# Patient Record
Sex: Female | Born: 1965 | Race: White | Hispanic: No | Smoking: Never smoker
Health system: Southern US, Community
[De-identification: ages and names within clinical notes are randomized; demographics above are authoritative.]

## PROBLEM LIST (undated history)

## (undated) DIAGNOSIS — R87619 Unspecified abnormal cytological findings in specimens from cervix uteri: Secondary | ICD-10-CM

## (undated) DIAGNOSIS — N920 Excessive and frequent menstruation with regular cycle: Secondary | ICD-10-CM

## (undated) DIAGNOSIS — K219 Gastro-esophageal reflux disease without esophagitis: Secondary | ICD-10-CM

## (undated) DIAGNOSIS — Z8742 Personal history of other diseases of the female genital tract: Secondary | ICD-10-CM

## (undated) HISTORY — PX: TONSILLECTOMY AND ADENOIDECTOMY: SUR1326

## (undated) HISTORY — DX: Personal history of other diseases of the female genital tract: Z87.42

## (undated) HISTORY — DX: Unspecified abnormal cytological findings in specimens from cervix uteri: R87.619

## (undated) HISTORY — PX: FOOT SURGERY: SHX648

## (undated) HISTORY — DX: Excessive and frequent menstruation with regular cycle: N92.0

## (undated) HISTORY — PX: APPENDECTOMY: SHX54

## (undated) HISTORY — PX: COLPOSCOPY VULVA W/ BIOPSY: SUR282

---

## 2003-05-21 ENCOUNTER — Ambulatory Visit (HOSPITAL_COMMUNITY): Admission: RE | Admit: 2003-05-21 | Discharge: 2003-05-21 | Payer: Self-pay | Admitting: Orthopaedic Surgery

## 2004-03-18 ENCOUNTER — Ambulatory Visit (HOSPITAL_COMMUNITY): Admission: RE | Admit: 2004-03-18 | Discharge: 2004-03-18 | Payer: Self-pay | Admitting: Obstetrics and Gynecology

## 2004-04-29 ENCOUNTER — Ambulatory Visit (HOSPITAL_COMMUNITY): Admission: RE | Admit: 2004-04-29 | Discharge: 2004-04-29 | Payer: Self-pay | Admitting: Urology

## 2004-11-17 ENCOUNTER — Ambulatory Visit: Payer: Self-pay | Admitting: *Deleted

## 2004-11-20 ENCOUNTER — Ambulatory Visit (HOSPITAL_COMMUNITY): Admission: RE | Admit: 2004-11-20 | Discharge: 2004-11-20 | Payer: Self-pay | Admitting: *Deleted

## 2004-11-20 ENCOUNTER — Ambulatory Visit: Payer: Self-pay | Admitting: Cardiology

## 2004-11-28 ENCOUNTER — Ambulatory Visit: Payer: Self-pay | Admitting: *Deleted

## 2006-06-25 IMAGING — CR DG CHEST 2V
2 series · 2 of 2 positions shown · non-contrast
Comparison: none.

CLINICAL DATA: Screening for work. Knot around sternum.
 TWO VIEW CHEST

[view not recorded (1 of 2)]
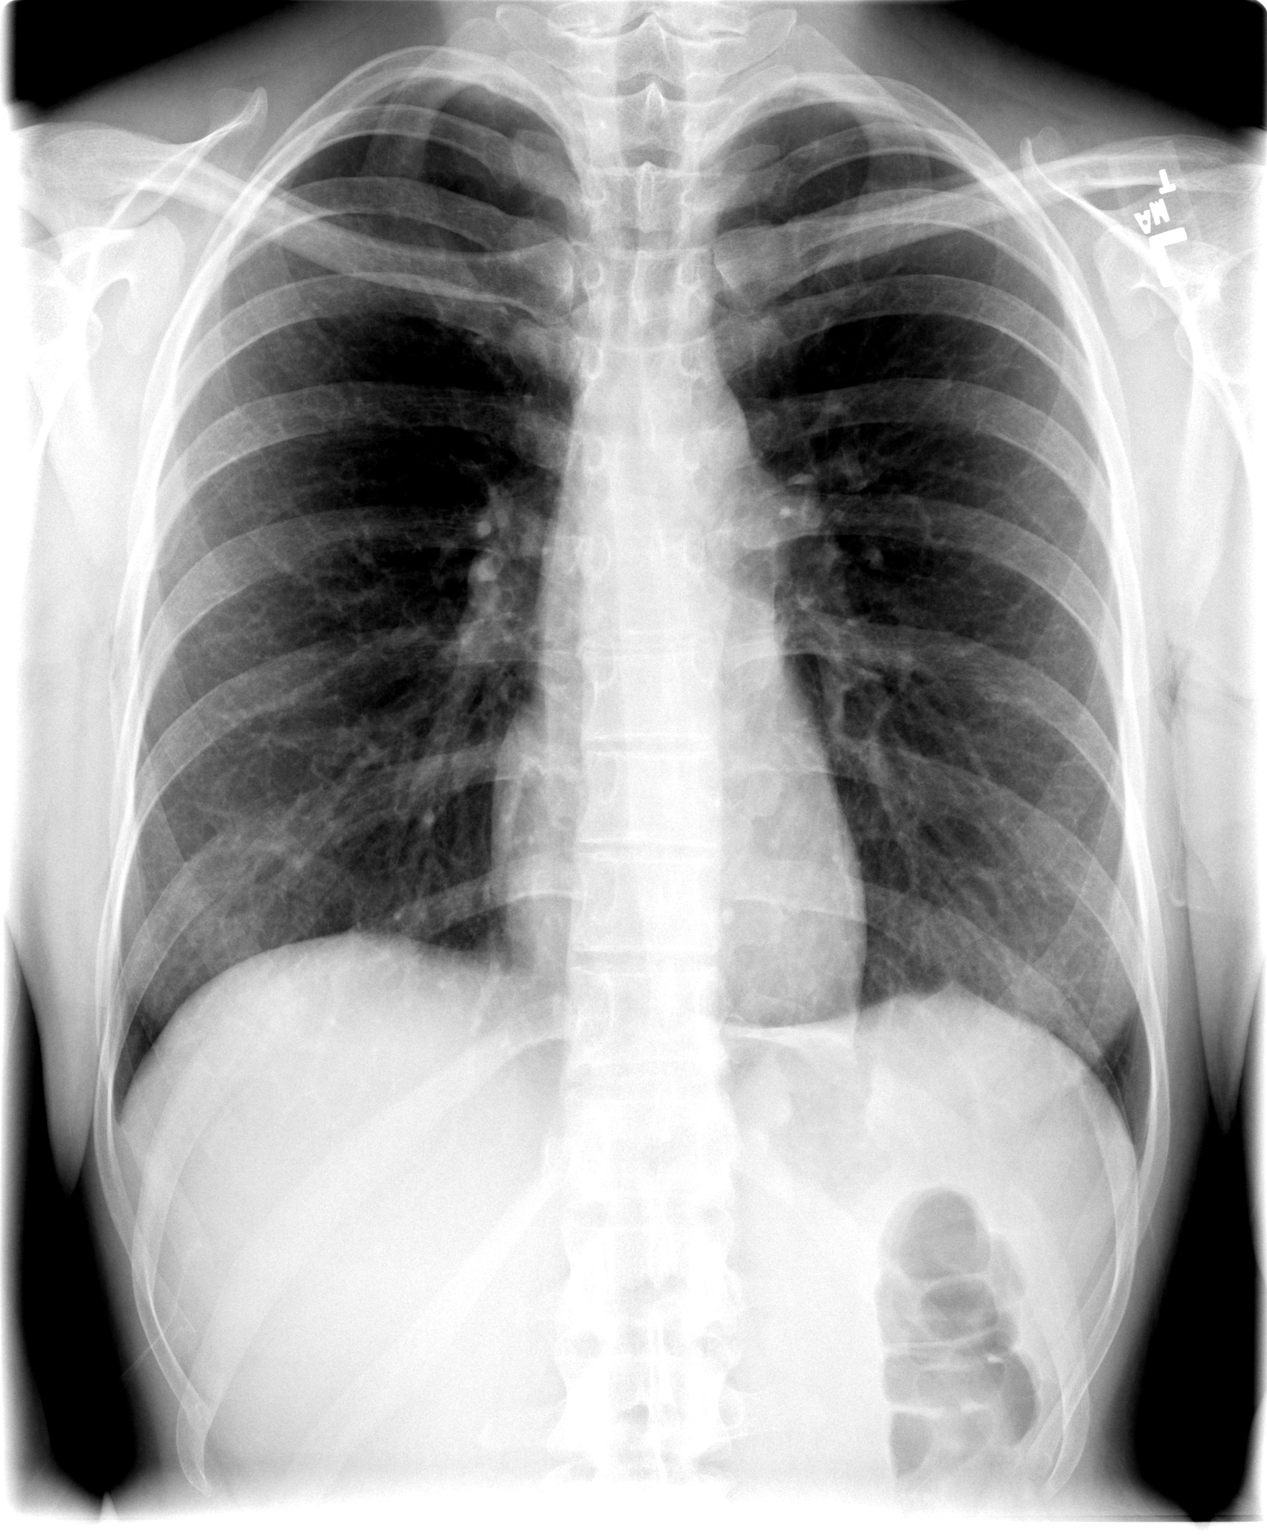

[view not recorded (2 of 2)]
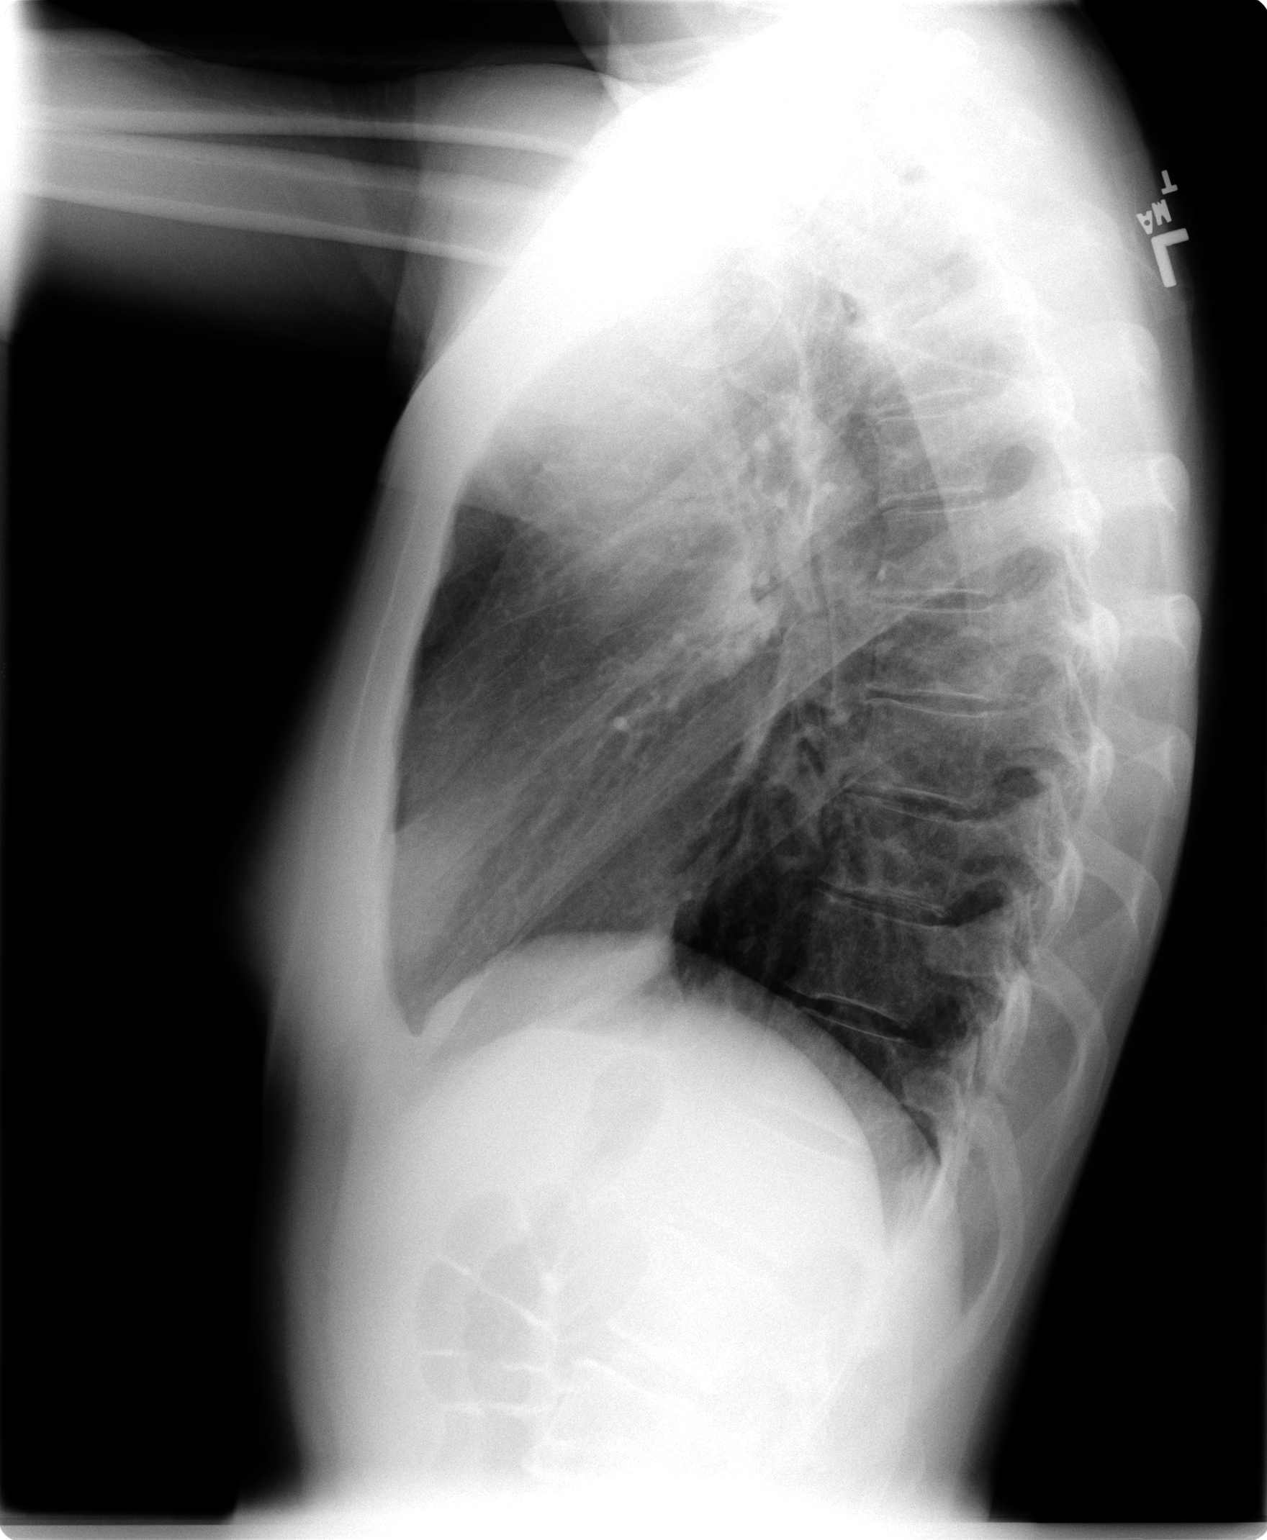

[2 of 2 positions shown; findings below may reference images not displayed]

FINDINGS: Two view chest shows no focal consolidation, edema or effusion.  Cardiopericardial silhouette is within normal limits for size.  Bony structures of the imaged thorax are intact.
 IMPRESSION
 Normal two view chest x-ray. CT scanning would be a more sensitive means to evaluate the sternum if clinically indicated.

## 2007-04-18 ENCOUNTER — Other Ambulatory Visit: Admission: RE | Admit: 2007-04-18 | Discharge: 2007-04-18 | Payer: Self-pay | Admitting: Obstetrics and Gynecology

## 2007-06-21 ENCOUNTER — Ambulatory Visit: Payer: Self-pay

## 2007-12-01 ENCOUNTER — Ambulatory Visit (HOSPITAL_COMMUNITY): Admission: RE | Admit: 2007-12-01 | Discharge: 2007-12-01 | Payer: Self-pay | Admitting: Podiatry

## 2008-05-15 ENCOUNTER — Other Ambulatory Visit: Admission: RE | Admit: 2008-05-15 | Discharge: 2008-05-15 | Payer: Self-pay | Admitting: Obstetrics and Gynecology

## 2008-06-01 ENCOUNTER — Ambulatory Visit (HOSPITAL_COMMUNITY): Admission: RE | Admit: 2008-06-01 | Discharge: 2008-06-01 | Payer: Self-pay | Admitting: Internal Medicine

## 2008-06-06 ENCOUNTER — Encounter (HOSPITAL_COMMUNITY): Admission: RE | Admit: 2008-06-06 | Discharge: 2008-07-06 | Payer: Self-pay | Admitting: Internal Medicine

## 2008-06-21 ENCOUNTER — Ambulatory Visit: Payer: Self-pay

## 2008-11-26 ENCOUNTER — Ambulatory Visit: Payer: Self-pay | Admitting: Family Medicine

## 2009-05-30 ENCOUNTER — Other Ambulatory Visit: Admission: RE | Admit: 2009-05-30 | Discharge: 2009-05-30 | Payer: Self-pay | Admitting: Obstetrics and Gynecology

## 2010-06-04 ENCOUNTER — Other Ambulatory Visit: Admission: RE | Admit: 2010-06-04 | Discharge: 2010-06-04 | Payer: Self-pay | Admitting: Obstetrics and Gynecology

## 2010-11-25 NOTE — Op Note (Signed)
NAME:  JAZMINN, POMALES                 ACCOUNT NO.:  [id number]   MEDICAL RECORD NO.:  [id number]          PATIENT TYPE:  AMB   LOCATION:  DAY                           FACILITY:  [facility]   PHYSICIAN:  Oley Balm. Pricilla Holm, D.P.M.DATE OF BIRTH:  1965-11-24   DATE OF PROCEDURE:  12/01/2007  DATE OF DISCHARGE:                               OPERATIVE REPORT   SURGEON:  Oley Balm. Pricilla Holm, D.P.M.   ANESTHESIA:  Monitored anesthesia care.   PREOPERATIVE DIAGNOSIS:  Tailor's bunion deformity, left foot.   POSTOPERATIVE DIAGNOSIS:  Tailor's bunion deformity, left foot.   PROCEDURE:  Tailor's bunionectomy with fifth metatarsal osteotomy and  screw fixation, fifth metatarsal, left foot.   INDICATIONS FOR PROCEDURE:  Long-standing history of pain and relieved  by conservative care.   PROCEDURE IN DETAIL:  The patient was brought to the operating room,  placed on the operating table in the supine position.  The patient's  lower left foot and leg was prepped and draped in the usual aseptic  manner.  Then with an ankle tourniquet placed, well padded to prevent  contusion, and elevated 250 mmHg after exsanguination of the left leg.  The following surgical procedure was then performed under monitored  anesthesia care, and local infiltrated with 2% Xylocaine.  The first  procedure to be fifth metatarsal osteotomy with tailor's bunionectomy  and 2-0 screw fixation.  Attention was directed to the dorsal lateral  aspect of the left foot at the level of the fifth MTP where a  curvilinear incision was made.  The incision was widened and deepened  via sharp and blunt dissection, made sure to identify tract all vital  structures.  A linear capsular incision was then made in the head of the  metatarsal freeing soft tissue  and attachments  dorsally and laterally.  Utilizing a Zimmer oscillating saw, an oblique osteotomy was made from  distal lateral to medial proximal.  The capital fragment was then slid  medially  and fixated with a 2-0 mm screw.  The remaining protruding  aspect of the fifth metatarsal laterally was excised.  The wound was  washed with copious amount of sterile saline, and the subcutaneous  tissues approximated to incision were sutured forward excellent, skin  was approximated by utilizing running subcuticular suture forward  excellent.   All surgical sites were then infiltrated with approximately 180 mL  dexamethasone phosphate, mild compressive bandages consisting of  Betadine  soaked Adaptic, sterile 4x4s, sterile Kling were applied.  The  patient tolerated the procedure well and left the operating room in  apparent good condition.  Vital signs were stable at recovery room.     Oley Balm Pricilla Holm, D.P.M.  Electronically Signed    DBT/MEDQ  D:  12/01/2007  T:  12/02/2007  Job:  [id number]

## 2010-11-28 NOTE — Procedures (Signed)
[name], [name] NO.:  [id number]   MEDICAL RECORD NO.:  [id number]          PATIENT TYPE:  OUT   LOCATION:  RAD                           FACILITY:  [facility]   PHYSICIAN:  Carlton Bing, M.D.  DATE OF BIRTH:  [birth year]   DATE OF PROCEDURE:  11/20/2004  DATE OF DISCHARGE:                                  ECHOCARDIOGRAM   REFERRING:  Dr. Ouida Sills and Dr. Dorethea Clan.   CLINICAL DATA:  A 45 year old woman with chest pain, murmur and dyspnea.   Aorta 2.7, left atrium 3.0, septum 0.9, posterior wall 0.9, LV diastole 4.3,  LV systole 3.0, RV diastole 3.4.   1.  Technically adequate echocardiographic study.  2.  Normal left atrium, right atrium and right ventricle.  3.  Slight mitral valve thickening with flat coaptation but no definite      prolapse; no mitral regurgitation.  4.  Trileaflet aortic valve with slight leaflet thickening; normal function.  5.  Normal tricuspid valve; trivial regurgitation; normal estimated RV      systolic pressure.  6.  Normal internal dimension, wall thickness, regional and global function      of the left ventricle.  7.  Normal IVC.      RR/MEDQ  D:  11/20/2004  T:  11/21/2004  Job:  [id number]

## 2011-04-08 LAB — BASIC METABOLIC PANEL
BUN: 7
CO2: 28
Calcium: 8.8
Chloride: 106
Creatinine, Ser: 0.79
GFR calc Af Amer: >60
GFR calc non Af Amer: >60
Glucose, Bld: 92
Potassium: 4
Sodium: 137

## 2011-04-08 LAB — HEMOGLOBIN AND HEMATOCRIT, BLOOD
HCT: 38.6
Hemoglobin: 13.7

## 2011-04-08 LAB — HCG, SERUM, QUALITATIVE: Preg, Serum: NEGATIVE

## 2011-06-12 ENCOUNTER — Other Ambulatory Visit: Payer: Self-pay | Admitting: Adult Health

## 2011-06-12 ENCOUNTER — Other Ambulatory Visit (HOSPITAL_COMMUNITY)
Admission: RE | Admit: 2011-06-12 | Discharge: 2011-06-12 | Disposition: A | Discharge status: Home / Self Care | Payer: BC Managed Care – PPO | Source: Ambulatory Visit | Attending: Obstetrics and Gynecology | Admitting: Obstetrics and Gynecology

## 2011-06-12 DIAGNOSIS — R87810 Cervical high risk human papillomavirus (HPV) DNA test positive: Secondary | ICD-10-CM | POA: Insufficient documentation

## 2011-06-12 DIAGNOSIS — Z113 Encounter for screening for infections with a predominantly sexual mode of transmission: Secondary | ICD-10-CM | POA: Insufficient documentation

## 2011-06-12 DIAGNOSIS — Z01419 Encounter for gynecological examination (general) (routine) without abnormal findings: Secondary | ICD-10-CM | POA: Insufficient documentation

## 2011-07-21 ENCOUNTER — Other Ambulatory Visit: Payer: Self-pay | Admitting: Obstetrics and Gynecology

## 2011-07-21 ENCOUNTER — Ambulatory Visit (HOSPITAL_COMMUNITY)
Admission: RE | Admit: 2011-07-21 | Discharge: 2011-07-21 | Disposition: A | Discharge status: Home / Self Care | Payer: BC Managed Care – PPO | Source: Ambulatory Visit | Attending: Obstetrics and Gynecology | Admitting: Obstetrics and Gynecology

## 2011-07-21 DIAGNOSIS — Z1231 Encounter for screening mammogram for malignant neoplasm of breast: Secondary | ICD-10-CM | POA: Insufficient documentation

## 2011-07-21 DIAGNOSIS — Z139 Encounter for screening, unspecified: Secondary | ICD-10-CM

## 2011-07-30 ENCOUNTER — Other Ambulatory Visit: Payer: Self-pay | Admitting: Obstetrics and Gynecology

## 2011-07-30 DIAGNOSIS — R928 Other abnormal and inconclusive findings on diagnostic imaging of breast: Secondary | ICD-10-CM

## 2011-08-05 ENCOUNTER — Ambulatory Visit (HOSPITAL_COMMUNITY)
Admission: RE | Admit: 2011-08-05 | Discharge: 2011-08-05 | Disposition: A | Discharge status: Home / Self Care | Payer: BC Managed Care – PPO | Source: Ambulatory Visit | Attending: Obstetrics and Gynecology | Admitting: Obstetrics and Gynecology

## 2011-08-05 DIAGNOSIS — R928 Other abnormal and inconclusive findings on diagnostic imaging of breast: Secondary | ICD-10-CM | POA: Insufficient documentation

## 2011-08-07 ENCOUNTER — Encounter (HOSPITAL_COMMUNITY): Payer: Self-pay | Admitting: Pharmacy Technician

## 2011-08-12 ENCOUNTER — Inpatient Hospital Stay (HOSPITAL_COMMUNITY): Admission: RE | Admit: 2011-08-12 | Payer: BC Managed Care – PPO | Source: Ambulatory Visit

## 2011-08-13 ENCOUNTER — Other Ambulatory Visit (HOSPITAL_COMMUNITY): Payer: BC Managed Care – PPO

## 2011-08-14 ENCOUNTER — Other Ambulatory Visit (HOSPITAL_COMMUNITY): Payer: BC Managed Care – PPO

## 2011-08-19 ENCOUNTER — Ambulatory Visit: Admit: 2011-08-19 | Payer: Self-pay | Admitting: Obstetrics and Gynecology

## 2011-08-19 SURGERY — HYSTERECTOMY, VAGINAL, LAPAROSCOPY-ASSISTED
Anesthesia: General

## 2012-11-23 ENCOUNTER — Other Ambulatory Visit: Payer: Self-pay | Admitting: Adult Health

## 2012-11-23 DIAGNOSIS — Z139 Encounter for screening, unspecified: Secondary | ICD-10-CM

## 2012-11-28 ENCOUNTER — Ambulatory Visit (HOSPITAL_COMMUNITY)
Admission: RE | Admit: 2012-11-28 | Discharge: 2012-11-28 | Disposition: A | Discharge status: Home / Self Care | Payer: BC Managed Care – PPO | Source: Ambulatory Visit | Attending: Adult Health | Admitting: Adult Health

## 2012-11-28 DIAGNOSIS — Z1231 Encounter for screening mammogram for malignant neoplasm of breast: Secondary | ICD-10-CM | POA: Insufficient documentation

## 2012-11-28 DIAGNOSIS — Z139 Encounter for screening, unspecified: Secondary | ICD-10-CM

## 2013-09-26 ENCOUNTER — Other Ambulatory Visit (HOSPITAL_COMMUNITY)
Admission: RE | Admit: 2013-09-26 | Discharge: 2013-09-26 | Disposition: A | Discharge status: Home / Self Care | Payer: BC Managed Care – PPO | Source: Ambulatory Visit | Attending: Adult Health | Admitting: Adult Health

## 2013-09-26 ENCOUNTER — Encounter: Payer: Self-pay | Admitting: Adult Health

## 2013-09-26 ENCOUNTER — Encounter (INDEPENDENT_AMBULATORY_CARE_PROVIDER_SITE_OTHER): Payer: Self-pay

## 2013-09-26 ENCOUNTER — Ambulatory Visit (INDEPENDENT_AMBULATORY_CARE_PROVIDER_SITE_OTHER): Payer: BC Managed Care – PPO | Admitting: Adult Health

## 2013-09-26 VITALS — BP 112/60 | HR 74 | Ht 67.0 in | Wt 159.0 lb

## 2013-09-26 DIAGNOSIS — Z01419 Encounter for gynecological examination (general) (routine) without abnormal findings: Secondary | ICD-10-CM

## 2013-09-26 DIAGNOSIS — Z1212 Encounter for screening for malignant neoplasm of rectum: Secondary | ICD-10-CM

## 2013-09-26 DIAGNOSIS — Z1151 Encounter for screening for human papillomavirus (HPV): Secondary | ICD-10-CM | POA: Insufficient documentation

## 2013-09-26 DIAGNOSIS — N920 Excessive and frequent menstruation with regular cycle: Secondary | ICD-10-CM

## 2013-09-26 DIAGNOSIS — Z8742 Personal history of other diseases of the female genital tract: Secondary | ICD-10-CM | POA: Insufficient documentation

## 2013-09-26 HISTORY — DX: Excessive and frequent menstruation with regular cycle: N92.0

## 2013-09-26 HISTORY — DX: Personal history of other diseases of the female genital tract: Z87.42

## 2013-09-26 LAB — HEMOCCULT GUIAC POC 1CARD (OFFICE): Fecal Occult Blood, POC: NEGATIVE

## 2013-09-26 NOTE — Patient Instructions (Signed)
Physical in 1 year Mammogram yearly Menorrhagia Menorrhagia is the medical term for when your menstrual periods are heavy or last longer than usual. With menorrhagia, every period you have may cause enough blood loss and cramping that you are unable to maintain your usual activities. CAUSES  In some cases, the cause of heavy periods is unknown, but a number of conditions may cause menorrhagia. Common causes include:  A problem with the hormone-producing thyroid gland (hypothyroid).  Noncancerous growths in the uterus (polyps or fibroids).  An imbalance of the estrogen and progesterone hormones.  One of your ovaries not releasing an egg during one or more months.  Side effects of having an intrauterine device (IUD).  Side effects of some medicines, such as anti-inflammatory medicines or blood thinners.  A bleeding disorder that stops your blood from clotting normally. SIGNS AND SYMPTOMS  During a normal period, bleeding lasts between 4 and 8 days. Signs that your periods are too heavy include:  You routinely have to change your pad or tampon every 1 or 2 hours because it is completely soaked.  You pass blood clots larger than 1 inch (2.5 cm) in size.  You have bleeding for more than 7 days.  You need to use pads and tampons at the same time because of heavy bleeding.  You need to wake up to change your pads or tampons during the night.  You have symptoms of anemia, such as tiredness, fatigue, or shortness of breath. DIAGNOSIS  Your health care provider will perform a physical exam and ask you questions about your symptoms and menstrual history. Other tests may be ordered based on what the health care provider finds during the exam. These tests can include:  Blood tests To check if you are pregnant or have hormonal changes, a bleeding or thyroid disorder, low iron levels (anemia), or other problems.  Endometrial biopsy Your health care provider takes a sample of tissue from the  inside of your uterus to be examined under a microscope.  Pelvic ultrasound This test uses sound waves to make a picture of your uterus, ovaries, and vagina. The pictures can show if you have fibroids or other growths.  Hysteroscopy For this test, your health care provider will use a small telescope to look inside your uterus. Based on the results of your initial tests, your health care provider may recommend further testing. TREATMENT  Treatment may not be needed. If it is needed, your health care provider may recommend treatment with one or more medicines first. If these do not reduce bleeding enough, a surgical treatment might be an option. The best treatment for you will depend on:   Whether you need to prevent pregnancy.  Your desire to have children in the future.  The cause and severity of your bleeding.  Your opinion and personal preference.  Medicines for menorrhagia may include:  Birth control methods that use hormones These include the pill, skin patch, vaginal ring, shots that you get every 3 months, hormonal IUD, and implant. These treatments reduce bleeding during your menstrual period.  Medicines that thicken blood and slow bleeding.  Medicines that reduce swelling, such as ibuprofen.  Medicines that contain a synthetic hormone called progestin.   Medicines that make the ovaries stop working for a short time.  You may need surgical treatment for menorrhagia if the medicines are unsuccessful. Treatment options include:  Dilation and curettage (D&C) In this procedure, your health care provider opens (dilates) your cervix and then scrapes or suctions  tissue from the lining of your uterus to reduce menstrual bleeding.  Operative hysteroscopy This procedure uses a tiny tube with a light (hysteroscope) to view your uterine cavity and can help in the surgical removal of a polyp that may be causing heavy periods.  Endometrial ablation Through various techniques, your  health care provider permanently destroys the entire lining of your uterus (endometrium). After endometrial ablation, most women have little or no menstrual flow. Endometrial ablation reduces your ability to become pregnant.  Endometrial resection This surgical procedure uses an electrosurgical wire loop to remove the lining of the uterus. This procedure also reduces your ability to become pregnant.  Hysterectomy Surgical removal of the uterus and cervix is a permanent procedure that stops menstrual periods. Pregnancy is not possible after a hysterectomy. This procedure requires anesthesia and hospitalization. HOME CARE INSTRUCTIONS   Only take over-the-counter or prescription medicines as directed by your health care provider. Take prescribed medicines exactly as directed. Do not change or switch medicines without consulting your health care provider.  Take any prescribed iron pills exactly as directed by your health care provider. Long-term heavy bleeding may result in low iron levels. Iron pills help replace the iron your body lost from heavy bleeding. Iron may cause constipation. If this becomes a problem, increase the bran, fruits, and roughage in your diet.  Do not take aspirin or medicines that contain aspirin 1 week before or during your menstrual period. Aspirin may make the bleeding worse.  If you need to change your sanitary pad or tampon more than once every 2 hours, stay in bed and rest as much as possible until the bleeding stops.  Eat well-balanced meals. Eat foods high in iron. Examples are leafy green vegetables, meat, liver, eggs, and whole grain breads and cereals. Do not try to lose weight until the abnormal bleeding has stopped and your blood iron level is back to normal. SEEK MEDICAL CARE IF:   You soak through a pad or tampon every 1 or 2 hours, and this happens every time you have a period.  You need to use pads and tampons at the same time because you are bleeding so  much.  You need to change your pad or tampon during the night.  You have a period that lasts for more than 8 days.  You pass clots bigger than 1 inch wide.  You have irregular periods that happen more or less often than once a month.  You feel dizzy or faint.  You feel very weak or tired.  You feel short of breath or feel your heart is beating too fast when you exercise.  You have nausea and vomiting or diarrhea while you are taking your medicine.  You have any problems that may be related to the medicine you are taking. SEEK IMMEDIATE MEDICAL CARE IF:   You soak through 4 or more pads or tampons in 2 hours.  You have any bleeding while you are pregnant. MAKE SURE YOU:   Understand these instructions.  Will watch your condition.  Will get help right away if you are not doing well or get worse. Document Released: 06/29/2005 Document Revised: 04/19/2013 Document Reviewed: 12/18/2012 [facility] Patient Information 2014 [place], [place].

## 2013-09-26 NOTE — Progress Notes (Signed)
Patient ID: Krystal [name], female   DOB: 01/06/1966, 48 y.o.   MRN: [id number] History of Present Illness: Krystal RhodesBetty is a 48 year old white female in for pap and physical.She has history of abnormal pap.She is complaining of heavy periods and clots.No hot flashes.  Current Medications, Allergies, Past Medical History, Past Surgical History, Family History and Social History were reviewed in [facility] electronic medical record.     Review of Systems: Patient denies any headaches, blurred vision, shortness of breath, chest pain, abdominal pain, problems with bowel movements, urination, or intercourse. No joint pain or mood swings.   Physical Exam:BP 112/60  Pulse 74  Ht 5\' 7"  (1.702 m)  Wt 159 lb (72.122 kg)  BMI 24.90 kg/m2  LMP 09/20/2013 General:  Well developed, well nourished, no acute distress Skin:  Warm and dry Neck:  Midline trachea, normal thyroid Lungs; Clear to auscultation bilaterally Breast:  No dominant palpable mass, retraction, or nipple discharge Cardiovascular: Regular rate and rhythm Abdomen:  Soft, non tender, no hepatosplenomegaly Pelvic:  External genitalia is normal in appearance.  The vagina is normal in appearance.  The cervix is nulliparous, pap performed with HPV.  Uterus is felt to be normal size, shape, and contour.  No                adnexal masses or tenderness noted. Rectal: Good sphincter tone, no polyps, or hemorrhoids felt.  Hemoccult negative. Extremities:  No swelling or varicosities noted Psych:  No mood changes, alert and cooperative,seems happy Discussed may need US and labs,in near future to assess bleeding, but will await pap results, was going to get LAVH in past but cancelled when Mom had stroke.Discussed could do ablation or try megace for bleeding if pap normal.  Impression: Yearly gyn exam History of abnormal pap Menorrhagia    Plan: Physical in 1 year Mammogram yearly Review handout on ablation and menorrhagia Had normal labs  06/2012.

## 2013-09-27 ENCOUNTER — Other Ambulatory Visit: Payer: Self-pay | Admitting: Adult Health

## 2013-09-29 ENCOUNTER — Telehealth: Payer: Self-pay | Admitting: Adult Health

## 2013-09-29 NOTE — Telephone Encounter (Signed)
Pt aware pap normal, with negative HPV, make appt with [facility] to discuss ablation, declines megace

## 2014-04-02 ENCOUNTER — Telehealth: Payer: Self-pay | Admitting: Adult Health

## 2014-04-02 NOTE — Telephone Encounter (Signed)
Pt bleeding heavy, wants ablation ,to come in tomorrow at 4pm to see Dr Emelda Fear

## 2014-04-03 ENCOUNTER — Ambulatory Visit (INDEPENDENT_AMBULATORY_CARE_PROVIDER_SITE_OTHER): Payer: BC Managed Care – PPO | Admitting: Obstetrics and Gynecology

## 2014-04-03 ENCOUNTER — Other Ambulatory Visit: Payer: Self-pay | Admitting: Adult Health

## 2014-04-03 ENCOUNTER — Encounter: Payer: Self-pay | Admitting: Obstetrics and Gynecology

## 2014-04-03 VITALS — BP 100/60 | Ht 67.0 in | Wt 156.0 lb

## 2014-04-03 DIAGNOSIS — Z139 Encounter for screening, unspecified: Secondary | ICD-10-CM

## 2014-04-03 DIAGNOSIS — N920 Excessive and frequent menstruation with regular cycle: Secondary | ICD-10-CM

## 2014-04-03 NOTE — Progress Notes (Signed)
Patient ID: Krystal [name], female   DOB: 05-Jan-1966, 48 y.o.   MRN: [id number] Pt here today for ;  [facility] Visit  [name] MRN [id number]  Date of birth: December 20, 1965  CC & HPI:  Krystal [name] is a 48 y.o. female presenting today for heavy bleeding., usually every 4 hr pad changes,since d/c of OCP. Pt states that she started her period on Sunday and yesterday she changed both her pad and tampon every 2 hours. Pt states that she always has to use both pads and tampons but has never had to change every 2 hours like that. Pt is concerned about this and wants to know she can do.   Just quit pills, no complications.   ROS:  Hx abnormal pap's, but pap was okay this year.   Previously had plans for hysterectomy, but was postponed due to family illness (2013).  Pertinent History Reviewed:   Reviewed: Significant for  Medical         Past Medical History  Diagnosis Date  . Abnormal Pap smear of cervix   . Menorrhagia 09/26/2013  . History of abnormal cervical Pap smear 09/26/2013                              Surgical Hx:    Past Surgical History  Procedure Laterality Date  . Foot surgery Bilateral   . Appendectomy    . Tonsillectomy and adenoidectomy    . Colposcopy vulva w/ biopsy     Medications: Reviewed & Updated - see associated section                      Current outpatient prescriptions:aspirin-acetaminophen-caffeine (EXCEDRIN MIGRAINE) 250-250-65 MG per tablet, Take 1 tablet by mouth every 6 (six) hours as needed for headache., Disp: , Rfl: ;  ibuprofen (ADVIL,MOTRIN) 200 MG tablet, Take 600 mg by mouth every 6 (six) hours as needed. For pain, Disp: , Rfl: ;  Multiple Vitamin (MULITIVITAMIN WITH MINERALS) TABS, Take 1 tablet by mouth daily., Disp: , Rfl:    Social History: Reviewed -  reports that she has never smoked. She has never used smokeless tobacco.  Objective Findings:  Vitals: Blood pressure 100/60, height  (1.702 m), weight 156 lb  (70.761 kg), last menstrual period 04/01/2014.  Physical Examination: General appearance - alert, well appearing, and in no distress, oriented to person, place, and time and normal appearing weight Mental status - alert, oriented to person, place, and time Pelvic - normal external genitalia, vulva, vagina, cervix, uterus and adnexa, UTERUS: uterus is normal size, shape, consistency and nontender, retroverted   Assessment & Plan:   A: menorrhagia, desires iud for control Plan: Mirena IUD asap

## 2014-04-06 ENCOUNTER — Encounter: Payer: Self-pay | Admitting: Obstetrics and Gynecology

## 2014-04-06 ENCOUNTER — Ambulatory Visit (HOSPITAL_COMMUNITY): Payer: BC Managed Care – PPO

## 2014-04-06 ENCOUNTER — Ambulatory Visit (INDEPENDENT_AMBULATORY_CARE_PROVIDER_SITE_OTHER): Payer: BC Managed Care – PPO | Admitting: Obstetrics and Gynecology

## 2014-04-06 ENCOUNTER — Ambulatory Visit (HOSPITAL_COMMUNITY)
Admission: RE | Admit: 2014-04-06 | Discharge: 2014-04-06 | Disposition: A | Discharge status: Home / Self Care | Payer: BC Managed Care – PPO | Source: Ambulatory Visit | Attending: Adult Health | Admitting: Adult Health

## 2014-04-06 VITALS — BP 110/70 | Ht 67.0 in | Wt 156.0 lb

## 2014-04-06 DIAGNOSIS — Z30430 Encounter for insertion of intrauterine contraceptive device: Secondary | ICD-10-CM

## 2014-04-06 DIAGNOSIS — Z3200 Encounter for pregnancy test, result unknown: Secondary | ICD-10-CM

## 2014-04-06 DIAGNOSIS — Z1231 Encounter for screening mammogram for malignant neoplasm of breast: Secondary | ICD-10-CM | POA: Diagnosis present

## 2014-04-06 DIAGNOSIS — Z139 Encounter for screening, unspecified: Secondary | ICD-10-CM

## 2014-04-06 DIAGNOSIS — N920 Excessive and frequent menstruation with regular cycle: Secondary | ICD-10-CM

## 2014-04-06 DIAGNOSIS — Z3202 Encounter for pregnancy test, result negative: Secondary | ICD-10-CM

## 2014-04-06 LAB — POCT URINE PREGNANCY: Preg Test, Ur: NEGATIVE

## 2014-04-06 NOTE — Progress Notes (Addendum)
  Krystal [name] is a 48 y.o. No obstetric history on file. here for Mirena IUD removal and reinsertion. No GYN concerns.  Last pap smear  was normal. She states that she is allergic to sulfa.   IUD Removal and Reinsertion  Patient identified, informed consent performed. Discussed risks of irregular bleeding, cramping, infection, malpositioning or misplacement of the IUD outside the uterus which may require further procedures. Time out was performed. Speculum placed in the vagina. Cervix visualized. Cleaned with Betadine x 2. 10 cc of 1% Lidocaine injected into the intracervical and paracervical block. Grasped anteriorly with a single tooth tenaculum. Uterus sounded with a 9 mm sound  to 8 cm. Uterus was in the retroverted position. Liletta IUD placed per manufacturer's recommendations (Lot number [id number].) Strings trimmed to 2-3 cm. Tenaculum was removed, good hemostasis noted. Patient tolerated procedure well. Patient was given post-procedure instructions.  Patient was also asked to check IUD strings periodically and offered follow up in 4 weeks for IUD check.  This chart was scribed for Tilda Burrow, MD by Chestine Spore, ED Scribe. The patient was seen in room 1 at 1:16 PM.   Pt took motrin p procedure 800 mg

## 2014-05-04 ENCOUNTER — Ambulatory Visit: Payer: BC Managed Care – PPO | Admitting: Obstetrics and Gynecology

## 2014-05-08 ENCOUNTER — Encounter: Payer: Self-pay | Admitting: Obstetrics and Gynecology

## 2014-05-08 ENCOUNTER — Ambulatory Visit (INDEPENDENT_AMBULATORY_CARE_PROVIDER_SITE_OTHER): Payer: BC Managed Care – PPO | Admitting: Obstetrics and Gynecology

## 2014-05-08 VITALS — BP 110/60 | Ht 67.0 in | Wt 159.0 lb

## 2014-05-08 DIAGNOSIS — Z30431 Encounter for routine checking of intrauterine contraceptive device: Secondary | ICD-10-CM

## 2014-05-08 NOTE — Progress Notes (Signed)
   [facility] Visit  [name] MRN [id number]  Date of birth: 1966/05/19  CC & HPI:  Krystal [name] is a 48 y.o. female presenting today for IUD string check  ROS:  Has been spotting off and on, will let us know if it continues p 3 months.  Pertinent History Reviewed:   Reviewed: Significant for n/a Medical         Past Medical History  Diagnosis Date  . Abnormal Pap smear of cervix   . Menorrhagia 09/26/2013  . History of abnormal cervical Pap smear 09/26/2013                              Surgical Hx:    Past Surgical History  Procedure Laterality Date  . Foot surgery Bilateral   . Appendectomy    . Tonsillectomy and adenoidectomy    . Colposcopy vulva w/ biopsy     Medications: Reviewed & Updated - see associated section                      Current outpatient prescriptions:aspirin-acetaminophen-caffeine (EXCEDRIN MIGRAINE) 250-250-65 MG per tablet, Take 1 tablet by mouth every 6 (six) hours as needed for headache., Disp: , Rfl: ;  levonorgestrel (MIRENA) 20 MCG/24HR IUD, 1 each by Intrauterine route once., Disp: , Rfl: ;  Multiple Vitamin (MULITIVITAMIN WITH MINERALS) TABS, Take 1 tablet by mouth daily., Disp: , Rfl:    Social History: Reviewed -  reports that she has never smoked. She has never used smokeless tobacco.  Objective Findings:  Vitals: Blood pressure 110/60, height 5\' 7"  (1.702 m), weight 159 lb (72.122 kg).  Physical Examination: General appearance - alert, well appearing, and in no distress and oriented to person, place, and time Mental status - alert, oriented to person, place, and time, normal mood, behavior, speech, dress, motor activity, and thought processes Physical Examination: Pelvic - normal external genitalia, vulva, vagina, cervix, uterus and adnexa, VULVA: normal appearing vulva with no masses, tenderness or lesions, VAGINA: normal appearing vagina with normal color and discharge, no lesions Cervix large bulbous, 4 cm  width. With 2 cm string visible. Lite blood present  Assessment & Plan:   A:  1. IUD recheck. p  F/u if bleeding at 3 months , consider u/s  P:  1.

## 2016-03-19 ENCOUNTER — Other Ambulatory Visit: Payer: Self-pay | Admitting: Adult Health

## 2016-03-19 ENCOUNTER — Ambulatory Visit (INDEPENDENT_AMBULATORY_CARE_PROVIDER_SITE_OTHER): Payer: BLUE CROSS/BLUE SHIELD | Admitting: Adult Health

## 2016-03-19 ENCOUNTER — Encounter: Payer: Self-pay | Admitting: Adult Health

## 2016-03-19 ENCOUNTER — Other Ambulatory Visit (HOSPITAL_COMMUNITY)
Admission: RE | Admit: 2016-03-19 | Discharge: 2016-03-19 | Disposition: A | Discharge status: Home / Self Care | Payer: BLUE CROSS/BLUE SHIELD | Source: Ambulatory Visit | Attending: Adult Health | Admitting: Adult Health

## 2016-03-19 ENCOUNTER — Ambulatory Visit (HOSPITAL_COMMUNITY)
Admission: RE | Admit: 2016-03-19 | Discharge: 2016-03-19 | Disposition: A | Discharge status: Home / Self Care | Payer: BLUE CROSS/BLUE SHIELD | Source: Ambulatory Visit | Attending: Adult Health | Admitting: Adult Health

## 2016-03-19 VITALS — BP 94/78 | HR 70 | Ht 66.0 in | Wt 144.0 lb

## 2016-03-19 DIAGNOSIS — Z975 Presence of (intrauterine) contraceptive device: Secondary | ICD-10-CM | POA: Insufficient documentation

## 2016-03-19 DIAGNOSIS — Z1212 Encounter for screening for malignant neoplasm of rectum: Secondary | ICD-10-CM | POA: Diagnosis not present

## 2016-03-19 DIAGNOSIS — Z8742 Personal history of other diseases of the female genital tract: Secondary | ICD-10-CM

## 2016-03-19 DIAGNOSIS — Z1151 Encounter for screening for human papillomavirus (HPV): Secondary | ICD-10-CM | POA: Diagnosis not present

## 2016-03-19 DIAGNOSIS — Z01419 Encounter for gynecological examination (general) (routine) without abnormal findings: Secondary | ICD-10-CM | POA: Insufficient documentation

## 2016-03-19 DIAGNOSIS — Z1231 Encounter for screening mammogram for malignant neoplasm of breast: Secondary | ICD-10-CM

## 2016-03-19 DIAGNOSIS — N920 Excessive and frequent menstruation with regular cycle: Secondary | ICD-10-CM

## 2016-03-19 LAB — HEMOCCULT GUIAC POC 1CARD (OFFICE): Fecal Occult Blood, POC: NEGATIVE

## 2016-03-19 MED ORDER — MEGESTROL ACETATE 40 MG PO TABS: ORAL_TABLET | ORAL | 1 refills | Status: DC

## 2016-03-19 NOTE — Progress Notes (Signed)
Patient ID: Krystal [name], female   DOB: 10/20/1965, 50 y.o.   MRN: [id number] History of Present Illness: Krystal [name] is a 50 year old white female in for a well woman gyn exam and pap.Has IUD and periods still heavy at times, last one had back pain too.   Current Medications, Allergies, Past Medical History, Past Surgical History, Family History and Social History were reviewed in [facility] electronic medical record.     Review of Systems: Patient denies any headaches, hearing loss, fatigue, blurred vision, shortness of breath, chest pain, abdominal pain, problems with bowel movements, urination, or intercourse. No joint pain or mood swings. See HPI for positives.  Physical Exam:BP 94/78   Pulse 70   Ht 5\' 6"  (1.676 m)   Wt 144 lb (65.3 kg)   LMP 03/11/2016   BMI 23.24 kg/m  General:  Well developed, well nourished, no acute distress Skin:  Warm and dry Neck:  Midline trachea, normal thyroid, good ROM, no lymphadenopathy Lungs; Clear to auscultation bilaterally Breast:  No dominant palpable mass, retraction, or nipple discharge Cardiovascular: Regular rate and rhythm Abdomen:  Soft, non tender, no hepatosplenomegaly Pelvic:  External genitalia is normal in appearance, no lesions.  The vagina is normal in appearance. Urethra has no lesions or masses. The cervix is smooth, +IUD strings, pap with HPV performed. Uterus is felt to be normal size, shape, and contour.  No adnexal masses or tenderness noted.Bladder is non tender, no masses felt. Rectal: Good sphincter tone, no polyps, or hemorrhoids felt.  Hemoccult negative. Extremities/musculoskeletal:  No swelling or varicosities noted, no clubbing or cyanosis Psych:  No mood changes, alert and cooperative,seems happy Discussed trying megace to stop periods and she agrees.[facility]   Impression: 1. Encounter for gynecological examination with Papanicolaou smear of cervix   2. Cervical smear, as part of  routine gynecological examination   3. Menorrhagia with regular cycle   4. IUD (intrauterine device) in place   5. History of abnormal cervical Pap smear       Plan: Physical in 1 year Mammogram now and yearly Labs at work Rx megace 40 mg #45 3 x 5 days then 2 x 5 days then 1 daily with 1 refill, start before next period  Colonoscopy at 51

## 2016-03-19 NOTE — Addendum Note (Signed)
Addended by: Colen DarlingYOUNG, Breyton Vanscyoc S on: 03/19/2016 09:22 AM   Modules accepted: Orders

## 2016-03-19 NOTE — Patient Instructions (Addendum)
Physical in 1 year Mammogram now and yearly Colonoscopy at 51  Labs at work  Take megace before period starts to try to stop or slow the bleeding

## 2016-03-20 LAB — CYTOLOGY - PAP

## 2016-12-31 DIAGNOSIS — B349 Viral infection, unspecified: Secondary | ICD-10-CM | POA: Diagnosis not present

## 2017-09-02 ENCOUNTER — Encounter: Payer: Self-pay | Admitting: Orthopaedic Surgery

## 2017-09-02 ENCOUNTER — Ambulatory Visit (INDEPENDENT_AMBULATORY_CARE_PROVIDER_SITE_OTHER): Payer: BLUE CROSS/BLUE SHIELD | Admitting: Orthopaedic Surgery

## 2017-09-02 VITALS — BP 112/77 | HR 90 | Temp 98.4°F | Ht 64.0 in | Wt 152.0 lb

## 2017-09-02 DIAGNOSIS — G8929 Other chronic pain: Secondary | ICD-10-CM

## 2017-09-02 DIAGNOSIS — M25511 Pain in right shoulder: Secondary | ICD-10-CM | POA: Diagnosis not present

## 2017-09-02 NOTE — Progress Notes (Signed)
Subjective:    Patient ID: Krystal [name], female    DOB: 09/08/1965, 52 y.o.   MRN: [id number]  HPI She has chronic pain of the right shoulder. It has been bothering her more over the last few weeks with the repeated cold rainy days we have had.  She has pain with overhead use and extension.  She has no redness, no trauma, no numbness.  She has tried rest, heat, ice, rubs and Advil with only slight help.  I saw her several years ago and did an injection which helped significantly.  She would like an injection today.   Review of Systems  Musculoskeletal: Positive for arthralgias.  All other systems reviewed and are negative.  Past Medical History:  Diagnosis Date  . Abnormal Pap smear of cervix   . History of abnormal cervical Pap smear 09/26/2013  . Menorrhagia 09/26/2013    Past Surgical History:  Procedure Laterality Date  . APPENDECTOMY    . COLPOSCOPY VULVA W/ BIOPSY    . FOOT SURGERY Bilateral   . TONSILLECTOMY AND ADENOIDECTOMY      Current Outpatient Medications on File Prior to Visit  Medication Sig Dispense Refill  . aspirin-acetaminophen-caffeine (EXCEDRIN MIGRAINE) 250-250-65 MG per tablet Take 1 tablet by mouth every 6 (six) hours as needed for headache.    . levonorgestrel (MIRENA) 20 MCG/24HR IUD 1 each by Intrauterine route once.    . megestrol (MEGACE) 40 MG tablet Take 3 for 5 days, then 2 x 5 days then 1 daily 45 tablet 1  . Multiple Vitamin (MULITIVITAMIN WITH MINERALS) TABS Take 1 tablet by mouth daily.     No current facility-administered medications on file prior to visit.     Social History   Socioeconomic History  . Marital status: Divorced    Spouse name: Not on file  . Number of children: Not on file  . Years of education: Not on file  . Highest education level: Not on file  Social Needs  . Financial resource strain: Not on file  . Food insecurity - worry: Not on file  . Food insecurity - inability: Not on file  . Transportation needs -  medical: Not on file  . Transportation needs - non-medical: Not on file  Occupational History  . Not on file  Tobacco Use  . Smoking status: Never Smoker  . Smokeless tobacco: Never Used  Substance and Sexual Activity  . Alcohol use: No  . Drug use: No  . Sexual activity: Yes    Partners: Male    Birth control/protection: None, IUD    Comment: vasectomy  Other Topics Concern  . Not on file  Social History Narrative  . Not on file    Family History  Problem Relation Age of Onset  . Stroke Mother   . Hypertension Mother   . Cancer Maternal Aunt 30       breast  . Cancer Maternal Grandmother        breast    BP 112/77   Pulse 90   Temp 98.4 F (36.9 C)   Ht 5\' 4"  (1.626 m)   Wt 152 lb (68.9 kg)   BMI 26.09 kg/m      Objective:   Physical Exam  Constitutional: She is oriented to person, place, and time. She appears well-developed and well-nourished.  HENT:  Head: Normocephalic and atraumatic.  Eyes: Conjunctivae and EOM are normal. Pupils are equal, round, and reactive to light.  Neck: Normal range of motion.  Neck supple.  Cardiovascular: Normal rate, regular rhythm and intact distal pulses.  Pulmonary/Chest: Effort normal.  Abdominal: Soft.  Musculoskeletal: She exhibits tenderness (Pain right shoulder, ROM forward 165, abduction 150, internal 30, external 35, extension 10, adduction full, ROM neck full, NV intact, Grips normal.  Left shoulder negative.).  Neurological: She is alert and oriented to person, place, and time. She displays normal reflexes. No cranial nerve deficit. She exhibits normal muscle tone. Coordination normal.  Skin: Skin is warm and dry.  Psychiatric: She has a normal mood and affect. Her behavior is normal. Judgment and thought content normal.  Vitals reviewed.         Assessment & Plan:   Encounter Diagnosis  Name Primary?  . Chronic right shoulder pain Yes   PROCEDURE NOTE:  The patient request injection, verbal consent was  obtained.  The right shoulder was prepped appropriately after time out was performed.   Sterile technique was observed and injection of 1 cc of Depo-Medrol 40 mg with several cc's of plain xylocaine. Anesthesia was provided by ethyl chloride and a 20-gauge needle was used to inject the shoulder area. A posterior approach was used.  The injection was tolerated well.  A band aid dressing was applied.  The patient was advised to apply ice later today and tomorrow to the injection sight as needed.  I will see as needed.  Call if any problem.  Precautions discussed.   Electronically Signed Darreld Mclean, MD 2/21/20194:15 PM

## 2017-12-20 DIAGNOSIS — M545 Low back pain: Secondary | ICD-10-CM | POA: Diagnosis not present

## 2017-12-20 DIAGNOSIS — M9904 Segmental and somatic dysfunction of sacral region: Secondary | ICD-10-CM | POA: Diagnosis not present

## 2017-12-20 DIAGNOSIS — M9903 Segmental and somatic dysfunction of lumbar region: Secondary | ICD-10-CM | POA: Diagnosis not present

## 2017-12-23 DIAGNOSIS — M9903 Segmental and somatic dysfunction of lumbar region: Secondary | ICD-10-CM | POA: Diagnosis not present

## 2017-12-23 DIAGNOSIS — M545 Low back pain: Secondary | ICD-10-CM | POA: Diagnosis not present

## 2017-12-23 DIAGNOSIS — M9904 Segmental and somatic dysfunction of sacral region: Secondary | ICD-10-CM | POA: Diagnosis not present

## 2017-12-30 DIAGNOSIS — M545 Low back pain: Secondary | ICD-10-CM | POA: Diagnosis not present

## 2017-12-30 DIAGNOSIS — M9903 Segmental and somatic dysfunction of lumbar region: Secondary | ICD-10-CM | POA: Diagnosis not present

## 2017-12-30 DIAGNOSIS — M9904 Segmental and somatic dysfunction of sacral region: Secondary | ICD-10-CM | POA: Diagnosis not present

## 2018-01-06 DIAGNOSIS — M9903 Segmental and somatic dysfunction of lumbar region: Secondary | ICD-10-CM | POA: Diagnosis not present

## 2018-01-06 DIAGNOSIS — M545 Low back pain: Secondary | ICD-10-CM | POA: Diagnosis not present

## 2018-01-06 DIAGNOSIS — M9904 Segmental and somatic dysfunction of sacral region: Secondary | ICD-10-CM | POA: Diagnosis not present

## 2018-01-14 DIAGNOSIS — M9903 Segmental and somatic dysfunction of lumbar region: Secondary | ICD-10-CM | POA: Diagnosis not present

## 2018-01-14 DIAGNOSIS — M9904 Segmental and somatic dysfunction of sacral region: Secondary | ICD-10-CM | POA: Diagnosis not present

## 2018-01-14 DIAGNOSIS — M545 Low back pain: Secondary | ICD-10-CM | POA: Diagnosis not present

## 2018-01-20 DIAGNOSIS — M545 Low back pain: Secondary | ICD-10-CM | POA: Diagnosis not present

## 2018-01-20 DIAGNOSIS — M9903 Segmental and somatic dysfunction of lumbar region: Secondary | ICD-10-CM | POA: Diagnosis not present

## 2018-01-20 DIAGNOSIS — M9904 Segmental and somatic dysfunction of sacral region: Secondary | ICD-10-CM | POA: Diagnosis not present

## 2018-01-27 DIAGNOSIS — M9903 Segmental and somatic dysfunction of lumbar region: Secondary | ICD-10-CM | POA: Diagnosis not present

## 2018-01-27 DIAGNOSIS — M9904 Segmental and somatic dysfunction of sacral region: Secondary | ICD-10-CM | POA: Diagnosis not present

## 2018-01-27 DIAGNOSIS — M545 Low back pain: Secondary | ICD-10-CM | POA: Diagnosis not present

## 2018-02-10 DIAGNOSIS — M9903 Segmental and somatic dysfunction of lumbar region: Secondary | ICD-10-CM | POA: Diagnosis not present

## 2018-02-10 DIAGNOSIS — M545 Low back pain: Secondary | ICD-10-CM | POA: Diagnosis not present

## 2018-02-10 DIAGNOSIS — M9904 Segmental and somatic dysfunction of sacral region: Secondary | ICD-10-CM | POA: Diagnosis not present

## 2018-03-03 DIAGNOSIS — M9903 Segmental and somatic dysfunction of lumbar region: Secondary | ICD-10-CM | POA: Diagnosis not present

## 2018-03-03 DIAGNOSIS — M9904 Segmental and somatic dysfunction of sacral region: Secondary | ICD-10-CM | POA: Diagnosis not present

## 2018-03-03 DIAGNOSIS — M545 Low back pain: Secondary | ICD-10-CM | POA: Diagnosis not present

## 2018-03-07 DIAGNOSIS — M545 Low back pain: Secondary | ICD-10-CM | POA: Diagnosis not present

## 2018-03-07 DIAGNOSIS — M9904 Segmental and somatic dysfunction of sacral region: Secondary | ICD-10-CM | POA: Diagnosis not present

## 2018-03-07 DIAGNOSIS — M9903 Segmental and somatic dysfunction of lumbar region: Secondary | ICD-10-CM | POA: Diagnosis not present

## 2018-03-28 DIAGNOSIS — M9903 Segmental and somatic dysfunction of lumbar region: Secondary | ICD-10-CM | POA: Diagnosis not present

## 2018-03-28 DIAGNOSIS — M9904 Segmental and somatic dysfunction of sacral region: Secondary | ICD-10-CM | POA: Diagnosis not present

## 2018-04-25 DIAGNOSIS — M9903 Segmental and somatic dysfunction of lumbar region: Secondary | ICD-10-CM | POA: Diagnosis not present

## 2018-04-25 DIAGNOSIS — M9904 Segmental and somatic dysfunction of sacral region: Secondary | ICD-10-CM | POA: Diagnosis not present

## 2018-04-25 DIAGNOSIS — M9907 Segmental and somatic dysfunction of upper extremity: Secondary | ICD-10-CM | POA: Diagnosis not present

## 2018-05-23 DIAGNOSIS — M9903 Segmental and somatic dysfunction of lumbar region: Secondary | ICD-10-CM | POA: Diagnosis not present

## 2018-05-23 DIAGNOSIS — M9902 Segmental and somatic dysfunction of thoracic region: Secondary | ICD-10-CM | POA: Diagnosis not present

## 2018-05-23 DIAGNOSIS — M9904 Segmental and somatic dysfunction of sacral region: Secondary | ICD-10-CM | POA: Diagnosis not present

## 2018-05-23 DIAGNOSIS — M9901 Segmental and somatic dysfunction of cervical region: Secondary | ICD-10-CM | POA: Diagnosis not present

## 2018-06-22 ENCOUNTER — Other Ambulatory Visit: Payer: Self-pay | Admitting: Adult Health

## 2018-06-22 DIAGNOSIS — Z1231 Encounter for screening mammogram for malignant neoplasm of breast: Secondary | ICD-10-CM

## 2018-06-27 ENCOUNTER — Ambulatory Visit (HOSPITAL_COMMUNITY)
Admission: RE | Admit: 2018-06-27 | Discharge: 2018-06-27 | Disposition: A | Discharge status: Home / Self Care | Payer: BLUE CROSS/BLUE SHIELD | Source: Ambulatory Visit | Attending: Adult Health | Admitting: Adult Health

## 2018-06-27 DIAGNOSIS — Z1231 Encounter for screening mammogram for malignant neoplasm of breast: Secondary | ICD-10-CM | POA: Diagnosis not present

## 2018-07-04 ENCOUNTER — Ambulatory Visit (INDEPENDENT_AMBULATORY_CARE_PROVIDER_SITE_OTHER): Payer: BLUE CROSS/BLUE SHIELD | Admitting: Adult Health

## 2018-07-04 ENCOUNTER — Encounter: Payer: Self-pay | Admitting: Adult Health

## 2018-07-04 ENCOUNTER — Other Ambulatory Visit (HOSPITAL_COMMUNITY)
Admission: RE | Admit: 2018-07-04 | Discharge: 2018-07-04 | Disposition: A | Discharge status: Home / Self Care | Payer: BLUE CROSS/BLUE SHIELD | Source: Ambulatory Visit | Attending: Adult Health | Admitting: Adult Health

## 2018-07-04 VITALS — BP 104/73 | HR 86 | Ht 67.0 in | Wt 152.8 lb

## 2018-07-04 DIAGNOSIS — Z01419 Encounter for gynecological examination (general) (routine) without abnormal findings: Secondary | ICD-10-CM

## 2018-07-04 DIAGNOSIS — Z1211 Encounter for screening for malignant neoplasm of colon: Secondary | ICD-10-CM

## 2018-07-04 DIAGNOSIS — Z1212 Encounter for screening for malignant neoplasm of rectum: Secondary | ICD-10-CM | POA: Diagnosis not present

## 2018-07-04 DIAGNOSIS — Z975 Presence of (intrauterine) contraceptive device: Secondary | ICD-10-CM

## 2018-07-04 LAB — HEMOCCULT GUIAC POC 1CARD (OFFICE): Fecal Occult Blood, POC: NEGATIVE

## 2018-07-04 NOTE — Progress Notes (Signed)
Patient ID: [name], female   DOB: Apr 03, 1966, 52 y.o.   MRN: [id number] History of Present Illness:  Krystal [name] is a 52 year old white female, divorced, G0P0, in for well woman gyn exam and pap. PCP Is Dr Krystal [name].  Current Medications, Allergies, Past Medical History, Past Surgical History, Family History and Social History were reviewed in [facility] electronic medical record.     Review of Systems: Patient denies any headaches, hearing loss, fatigue, blurred vision, shortness of breath, chest pain, abdominal pain, problems with bowel movements, urination, or intercourse. No joint pain or mood swings. Still having periods, with IUD, but they are not bad.    Physical Exam:BP 104/73 (BP Location: Right Arm, Patient Position: Sitting, Cuff Size: Normal)   Pulse 86   Ht 5\' 7"  (1.702 m)   Wt 152 lb 12.8 oz (69.3 kg)   LMP 06/13/2018   BMI 23.93 kg/m  General:  Well developed, well nourished, no acute distress Skin:  Warm and dry Neck:  Midline trachea, normal thyroid, good ROM, no lymphadenopathy Lungs; Clear to auscultation bilaterally Breast:  No dominant palpable mass, retraction, or nipple discharge Cardiovascular: Regular rate and rhythm Abdomen:  Soft, non tender, no hepatosplenomegaly Pelvic:  External genitalia is normal in appearance, no lesions.  The vagina is normal in appearance. Urethra has no lesions or masses. The cervix is smooth, +IUD string, pap with HPV performed. Uterus is felt to be normal size, shape, and contour.  No adnexal masses or tenderness noted.Bladder is non tender, no masses felt. Rectal: Good sphincter tone, no polyps, or hemorrhoids felt.  Hemoccult negative. Extremities/musculoskeletal:  No swelling or varicosities noted, no clubbing or cyanosis Psych:  No mood changes, alert and cooperative,seems happy Fall risk is low. PHQ 2 score is 0. Examination chaperoned by Krystal [name] CMA.   Impression: 1. Encounter for gynecological examination with  Papanicolaou smear of cervix   2. IUD (intrauterine device) in place   3. Screening for colorectal cancer       Plan: Check CBC,CMP,TSH and lipids Physical in 1 year Pap in 3 if normal Mammogram yearly  Check on insurance about cologuard, and let us know if you want to do that

## 2018-07-05 LAB — COMPREHENSIVE METABOLIC PANEL
A/G RATIO: 2.4 — AB (ref 1.2–2.2)
ALT: 16 IU/L (ref 0–32)
AST: 17 IU/L (ref 0–40)
Albumin: 4.8 g/dL (ref 3.5–5.5)
Alkaline Phosphatase: 66 IU/L (ref 39–117)
BUN/Creatinine Ratio: 15 (ref 9–23)
BUN: 12 mg/dL (ref 6–24)
Bilirubin Total: 0.6 mg/dL (ref 0.0–1.2)
CO2: 23 mmol/L (ref 20–29)
Calcium: 9.5 mg/dL (ref 8.7–10.2)
Chloride: 102 mmol/L (ref 96–106)
Creatinine, Ser: 0.81 mg/dL (ref 0.57–1.00)
GFR calc Af Amer: 97 mL/min/{1.73_m2} (ref >59)
GFR calc non Af Amer: 84 mL/min/{1.73_m2} (ref >59)
Globulin, Total: 2 g/dL (ref 1.5–4.5)
Glucose: 89 mg/dL (ref 65–99)
POTASSIUM: 4.4 mmol/L (ref 3.5–5.2)
Sodium: 140 mmol/L (ref 134–144)
Total Protein: 6.8 g/dL (ref 6.0–8.5)

## 2018-07-05 LAB — LIPID PANEL
Chol/HDL Ratio: 2.4 ratio (ref 0.0–4.4)
Cholesterol, Total: 150 mg/dL (ref 100–199)
HDL: 62 mg/dL (ref >39)
LDL Calculated: 69 mg/dL (ref 0–99)
Triglycerides: 96 mg/dL (ref 0–149)
VLDL Cholesterol Cal: 19 mg/dL (ref 5–40)

## 2018-07-05 LAB — CBC
Hematocrit: 43.5 % (ref 34.0–46.6)
Hemoglobin: 14.9 g/dL (ref 11.1–15.9)
MCH: 29.6 pg (ref 26.6–33.0)
MCHC: 34.3 g/dL (ref 31.5–35.7)
MCV: 87 fL (ref 79–97)
Platelets: 212 10*3/uL (ref 150–450)
RBC: 5.03 x10E6/uL (ref 3.77–5.28)
RDW: 12.7 % (ref 12.3–15.4)
WBC: 7.1 10*3/uL (ref 3.4–10.8)

## 2018-07-05 LAB — TSH: TSH: 2.18 u[IU]/mL (ref 0.450–4.500)

## 2018-07-08 LAB — CYTOLOGY - PAP
Adequacy: ABSENT
Diagnosis: NEGATIVE
HPV: NOT DETECTED

## 2018-07-11 ENCOUNTER — Telehealth: Payer: Self-pay | Admitting: Adult Health

## 2018-07-11 NOTE — Telephone Encounter (Signed)
Pt aware that pap is negative for malignancy and HPV and labs are good

## 2019-01-18 ENCOUNTER — Other Ambulatory Visit: Payer: Self-pay | Admitting: Orthopedic Surgery

## 2020-07-29 ENCOUNTER — Ambulatory Visit: Payer: BLUE CROSS/BLUE SHIELD | Admitting: Adult Health

## 2021-07-23 ENCOUNTER — Other Ambulatory Visit: Payer: Self-pay

## 2021-07-23 ENCOUNTER — Encounter: Payer: Self-pay | Admitting: Orthopedic Surgery

## 2021-07-23 ENCOUNTER — Ambulatory Visit: Payer: BC Managed Care – PPO

## 2021-07-23 ENCOUNTER — Ambulatory Visit (INDEPENDENT_AMBULATORY_CARE_PROVIDER_SITE_OTHER): Payer: BC Managed Care – PPO | Admitting: Orthopedic Surgery

## 2021-07-23 VITALS — BP 123/76 | HR 85 | Ht 67.0 in | Wt 164.8 lb

## 2021-07-23 DIAGNOSIS — G8929 Other chronic pain: Secondary | ICD-10-CM

## 2021-07-23 DIAGNOSIS — M7581 Other shoulder lesions, right shoulder: Secondary | ICD-10-CM | POA: Diagnosis not present

## 2021-07-23 DIAGNOSIS — M25511 Pain in right shoulder: Secondary | ICD-10-CM | POA: Diagnosis not present

## 2021-07-23 NOTE — Patient Instructions (Signed)

## 2021-07-23 NOTE — Progress Notes (Signed)
New Patient Visit  Assessment: Krystal [name] is a 56 y.o. female with the following: 1. Tendinitis of right rotator cuff  Plan: Patient has had pain in her right shoulder for several years.  Prior steroid injections have provided relief.  However, she continues to have pain in the anterior aspect of her shoulder.  Pain is worse at night.  She has difficulty with overhead motions, although she is able to complete her usual tasks.  We discussed proceeding with another injection, as these have done well in the past.  She will also start working on home exercises.  If she continues to have difficulty in the coming weeks, we can consider an MRI of the right shoulder to determine the underlying pathology.  Follow-up as needed.  Procedure note injection - Right shoulder    Verbal consent was obtained to inject the right shoulder, subacromial space Timeout was completed to confirm the site of injection.   The skin was prepped with alcohol and ethyl chloride was sprayed at the injection site.  A 21-gauge needle was used to inject 40 mg of Depo-Medrol and 1% lidocaine (3 cc) into the subacromial space of the right shoulder using a posterolateral approach.  There were no complications.  A sterile bandage was applied.     Follow-up: Return if symptoms worsen or fail to improve.  Subjective:  Chief Complaint  Patient presents with   New Patient (Initial Visit)   Shoulder Pain    RT shoulder Has been painful for 5-6 years    History of Present Illness: Krystal [name] is a 56 y.o. RHD female who presents for evaluation of right shoulder pain.  She has had pain in the anterior aspect of the right shoulder for 5-6 years.  No specific injury.  She has previously had steroid injections, which have provided relief, but the pain eventually returns.  Her pain gets worse at night.  Pain is in the anterior aspect of her right shoulder.  It does radiate distally.  At times, she feels a lot of pressure in her  upper arm.  In addition, she occasionally has stabbing pains in the middle of the scapula.  She occasionally takes ibuprofen.  She has not worked with a physical therapist recently.  She is not had an injection in almost 4 years.   Review of Systems: No fevers or chills No numbness or tingling No chest pain No shortness of breath No bowel or bladder dysfunction No GI distress No headaches   Medical History:  Past Medical History:  Diagnosis Date   Abnormal Pap smear of cervix    History of abnormal cervical Pap smear 09/26/2013   Menorrhagia 09/26/2013    Past Surgical History:  Procedure Laterality Date   APPENDECTOMY     COLPOSCOPY VULVA W/ BIOPSY     FOOT SURGERY Bilateral    TONSILLECTOMY AND ADENOIDECTOMY      Family History  Problem Relation Age of Onset   Stroke Mother    Hypertension Mother    Cancer Maternal Aunt 30       breast   Cancer Maternal Grandmother        breast   Social History   Tobacco Use   Smoking status: Never   Smokeless tobacco: Never  Vaping Use   Vaping Use: Never used  Substance Use Topics   Alcohol use: No   Drug use: No    Allergies  Allergen Reactions   Sulfa Antibiotics Swelling, Rash and Other (See  Comments)    blisters    Current Meds  Medication Sig   aspirin-acetaminophen-caffeine (EXCEDRIN MIGRAINE) 250-250-65 MG per tablet Take 1 tablet by mouth every 6 (six) hours as needed for headache.   levonorgestrel (MIRENA) 20 MCG/24HR IUD 1 each by Intrauterine route once.   Multiple Vitamin (MULITIVITAMIN WITH MINERALS) TABS Take 1 tablet by mouth daily.    Objective: BP 123/76    Pulse 85    Ht 5\' 7"  (1.702 m)    Wt 164 lb 12.8 oz (74.8 kg)    BMI 25.81 kg/m   Physical Exam:  General: Alert and oriented. and No acute distress. Gait: Normal gait.  Evaluation of right shoulder demonstrates no deformity.  No atrophy is appreciated.  Near full forward flexion, with some obvious discomfort.  Positive O'Brien's.   Positive Speed's, Negative Yergason's. Tenderness to palpation over the bicipital groove.  Exquisite pain in the empty can testing position.  4/5 strength in the supraspinatus.  5/5 infraspinatus strength.  Mild weakness demonstrated on belly press testing.  Internal rotation to T12.  Abduction to 90 degrees at her side.  Sensations intact throughout her right upper extremity.  2+ radial pulse.  IMAGING: I personally ordered and reviewed the following images  X-rays of the right shoulder obtained in clinic today.  Glenohumeral joint space is maintained.  No evidence of inferior osteophytes.  Slight disruption of the scapulohumeral arch.  Mild proximal humeral migration.  No acute injuries are noted.  Mild narrowing at the [facility] joint.  Small osteophytes are appreciated.  Impression: Right shoulder x-ray with potential evidence of chronic rotator cuff injury, and mild AC joint arthrosis  New Medications:  No orders of the defined types were placed in this encounter.     Mordecai Rasmussen, MD  07/23/2021 9:05 AM

## 2021-10-07 ENCOUNTER — Telehealth: Payer: Self-pay | Admitting: Radiology

## 2021-10-07 DIAGNOSIS — M7581 Other shoulder lesions, right shoulder: Secondary | ICD-10-CM

## 2021-10-07 NOTE — Telephone Encounter (Signed)
Patient called, said that the exercises she has been doing are not helping.  You mentioned that MRI was next step, she is ready to proceed with MRI.  Please let her know.  ?

## 2021-10-08 NOTE — Telephone Encounter (Signed)
Called pt and verified information for MRI,  ordered and steps went over with pt for scheduling.  ?

## 2021-10-08 NOTE — Addendum Note (Signed)
Addended by: Elizabeth Sauer on: 10/08/2021 01:51 PM ? ? Modules accepted: Orders ? ?

## 2021-10-17 ENCOUNTER — Ambulatory Visit (HOSPITAL_COMMUNITY)
Admission: RE | Admit: 2021-10-17 | Discharge: 2021-10-17 | Disposition: A | Discharge status: Home / Self Care | Payer: BC Managed Care – PPO | Source: Ambulatory Visit | Attending: Orthopedic Surgery | Admitting: Orthopedic Surgery

## 2021-10-17 DIAGNOSIS — M7581 Other shoulder lesions, right shoulder: Secondary | ICD-10-CM | POA: Diagnosis not present

## 2021-10-17 DIAGNOSIS — M25511 Pain in right shoulder: Secondary | ICD-10-CM | POA: Diagnosis not present

## 2021-10-30 ENCOUNTER — Ambulatory Visit (HOSPITAL_COMMUNITY): Payer: BC Managed Care – PPO

## 2021-11-05 ENCOUNTER — Ambulatory Visit (INDEPENDENT_AMBULATORY_CARE_PROVIDER_SITE_OTHER): Payer: BC Managed Care – PPO | Admitting: Orthopedic Surgery

## 2021-11-05 DIAGNOSIS — M75121 Complete rotator cuff tear or rupture of right shoulder, not specified as traumatic: Secondary | ICD-10-CM | POA: Diagnosis not present

## 2021-11-06 ENCOUNTER — Encounter: Payer: Self-pay | Admitting: Orthopedic Surgery

## 2021-11-06 NOTE — Progress Notes (Signed)
New Patient Visit ? ?Assessment: ?Krystal [name] is a 56 y.o. female with the following: ?Right shoulder rotator cuff tear, mild retraction.  ? ?Plan: ?[name] has a right shoulder rotator cuff tear with retraction.  I think it is amenable to arthroscopic repair.  Procedure and recovery discussed in great detail.  This is not urgent, but I think it can get worse if we wait too long.  She states her understanding.  She probably will not require surgical clearance.  If she wishes to proceed with surgery.  She will contact the clinic.  ? ? ?Follow-up: ?No follow-ups on file. ? ?Subjective: ? ?Chief Complaint  ?Patient presents with  ? Shoulder Pain  ?  RT shoulder/ MRI reviews  ? ? ?History of Present Illness: ?Krystal [name] is a 56 y.o. RHD female who returns for evaluation of right shoulder pain.  This has been ongoing for years.  She still has good function. Steroid injection helped, but is no longer effective.  She is here to discuss her MRI results.  ? ? ?Review of Systems: ?No fevers or chills ?No numbness or tingling ?No chest pain ?No shortness of breath ?No bowel or bladder dysfunction ?No GI distress ?No headaches ? ? ?Objective: ?There were no vitals taken for this visit. ? ?Physical Exam: ? ?General: Alert and oriented. and No acute distress. ?Gait: Normal gait. ? ?Evaluation of right shoulder demonstrates no deformity.  No atrophy is appreciated.  Near full forward flexion, with some obvious discomfort.  Positive O'Brien's.  Positive Speed's, Negative Yergason's. Tenderness to palpation over the bicipital groove.  Exquisite pain in the empty can testing position.  4/5 strength in the supraspinatus.  5/5 infraspinatus strength.  Mild weakness demonstrated on belly press testing.  Internal rotation to T12.  Abduction to 90 degrees at her side.  Sensations intact throughout her right upper extremity.  2+ radial pulse. ? ?IMAGING: ?I personally ordered and reviewed the following images ? ?Right shoulder  MRI ? ?Impression:  ? ?1. Full-thickness, partial width tear of the supraspinatus tendon ?with retraction. No muscle atrophy. ?2. Mild infraspinatus and subscapularis tendinosis. ?3. Mild acromioclavicular osteoarthritis. ? ?New Medications:  ?No orders of the defined types were placed in this encounter. ? ? ? ? ?Oliver Barre, MD ? ?11/06/2021 ?10:37 PM ? ? ?

## 2021-11-25 DIAGNOSIS — L738 Other specified follicular disorders: Secondary | ICD-10-CM | POA: Diagnosis not present

## 2021-11-25 DIAGNOSIS — L570 Actinic keratosis: Secondary | ICD-10-CM | POA: Diagnosis not present

## 2021-11-25 DIAGNOSIS — L578 Other skin changes due to chronic exposure to nonionizing radiation: Secondary | ICD-10-CM | POA: Diagnosis not present

## 2021-11-25 DIAGNOSIS — I781 Nevus, non-neoplastic: Secondary | ICD-10-CM | POA: Diagnosis not present

## 2022-09-10 ENCOUNTER — Encounter: Payer: Self-pay | Admitting: Radiology

## 2022-11-30 DIAGNOSIS — L570 Actinic keratosis: Secondary | ICD-10-CM | POA: Diagnosis not present

## 2022-11-30 DIAGNOSIS — Z808 Family history of malignant neoplasm of other organs or systems: Secondary | ICD-10-CM | POA: Diagnosis not present

## 2022-11-30 DIAGNOSIS — L578 Other skin changes due to chronic exposure to nonionizing radiation: Secondary | ICD-10-CM | POA: Diagnosis not present

## 2022-11-30 DIAGNOSIS — Z872 Personal history of diseases of the skin and subcutaneous tissue: Secondary | ICD-10-CM | POA: Diagnosis not present

## 2022-11-30 DIAGNOSIS — B351 Tinea unguium: Secondary | ICD-10-CM | POA: Diagnosis not present

## 2023-04-14 ENCOUNTER — Ambulatory Visit (INDEPENDENT_AMBULATORY_CARE_PROVIDER_SITE_OTHER): Payer: BC Managed Care – PPO | Admitting: Adult Health

## 2023-04-14 ENCOUNTER — Other Ambulatory Visit (HOSPITAL_COMMUNITY)
Admission: RE | Admit: 2023-04-14 | Discharge: 2023-04-14 | Disposition: A | Discharge status: Home / Self Care | Payer: BC Managed Care – PPO | Source: Ambulatory Visit | Attending: Adult Health | Admitting: Adult Health

## 2023-04-14 ENCOUNTER — Encounter: Payer: Self-pay | Admitting: Adult Health

## 2023-04-14 VITALS — BP 120/73 | HR 90 | Ht 67.0 in | Wt 163.0 lb

## 2023-04-14 DIAGNOSIS — Z01419 Encounter for gynecological examination (general) (routine) without abnormal findings: Secondary | ICD-10-CM | POA: Diagnosis not present

## 2023-04-14 DIAGNOSIS — Z30432 Encounter for removal of intrauterine contraceptive device: Secondary | ICD-10-CM | POA: Diagnosis not present

## 2023-04-14 DIAGNOSIS — Z1231 Encounter for screening mammogram for malignant neoplasm of breast: Secondary | ICD-10-CM

## 2023-04-14 DIAGNOSIS — Z780 Asymptomatic menopausal state: Secondary | ICD-10-CM

## 2023-04-14 DIAGNOSIS — Z1330 Encounter for screening examination for mental health and behavioral disorders, unspecified: Secondary | ICD-10-CM

## 2023-04-14 DIAGNOSIS — Z13220 Encounter for screening for lipoid disorders: Secondary | ICD-10-CM | POA: Diagnosis not present

## 2023-04-14 DIAGNOSIS — Z1211 Encounter for screening for malignant neoplasm of colon: Secondary | ICD-10-CM

## 2023-04-14 DIAGNOSIS — Z1212 Encounter for screening for malignant neoplasm of rectum: Secondary | ICD-10-CM

## 2023-04-14 LAB — HEMOCCULT GUIAC POC 1CARD (OFFICE): Fecal Occult Blood, POC: NEGATIVE

## 2023-04-14 NOTE — Progress Notes (Signed)
Patient ID: KIONI STAHL, female   DOB: September 04, 1965, 57 y.o.   MRN: [id number] History of Present Illness: Krystal [name] is a 57 year old white female, Divorced, G0P0, in for well woman gyn exam and pap. Last pap was in 2018-05-06. She has not had a period in years, has Mirena IUD since 05-06-14. She mom passed away recently.  PCP is Dr Ouida Sills   Current Medications, Allergies, Past Medical History, Past Surgical History, Family History and Social History were reviewed in [employer] electronic medical record.     Review of Systems: Patient denies any headaches, hearing loss, fatigue, blurred vision, shortness of breath, chest pain, abdominal pain, problems with bowel movements, urination, or intercourse(not active). No joint pain or mood swings.  No period in years, has IUD Has some hot flashes  Physical Exam:BP 120/73 (BP Location: Left Arm, Patient Position: Sitting, Cuff Size: Normal)   Pulse 90   Ht 5\' 7"  (1.702 m)   Wt 163 lb (73.9 kg)   LMP  (LMP Unknown) Comment: No periods with IUD in place  BMI 25.53 kg/m   General:  Well developed, well nourished, no acute distress Skin:  Warm and dry Neck:  Midline trachea, normal thyroid, good ROM, no lymphadenopathy Lungs; Clear to auscultation bilaterally Breast:  No dominant palpable mass, retraction, or nipple discharge Cardiovascular: Regular rate and rhythm Abdomen:  Soft, non tender, no hepatosplenomegaly Pelvic:  External genitalia is normal in appearance, no lesions.  The vagina is pale. Urethra has no lesions or masses. The cervix is smooth, pap with HR HPV genotyping performed, pt gave verbal consent to remove IUD and strings grasped with forceps and pt asked to cough and IUD easily removed.  Uterus is felt to be normal size, shape, and contour.  No adnexal masses or tenderness noted.Bladder is non tender, no masses felt. Rectal: Good sphincter tone, no polyps, or hemorrhoids felt.  Hemoccult negative. Extremities/musculoskeletal:  No swelling  or varicosities noted, no clubbing or cyanosis Psych:  No mood changes, alert and cooperative,seems happy AA is 4 Fall risk I low    04/14/2023   11:53 AM 07/04/2018    2:55 PM  Depression screen PHQ 2/9  Decreased Interest 2 0  Down, Depressed, Hopeless 2 0  PHQ - 2 Score 4 0  Altered sleeping 2   Tired, decreased energy 2   Change in appetite 2   Feeling bad or failure about yourself  0   Trouble concentrating 0   Moving slowly or fidgety/restless 2   Suicidal thoughts 0   PHQ-9 Score 12    Declines meds, teary when talks about Mom    04/14/2023   11:53 AM  GAD 7 : Generalized Anxiety Score  Nervous, Anxious, on Edge 2  Control/stop worrying 2  Worry too much - different things 2  Trouble relaxing 1  Restless 0  Easily annoyed or irritable 1  Afraid - awful might happen 0  Total GAD 7 Score 8      Upstream - 04/14/23 1149       Pregnancy Intention Screening   Does the patient want to become pregnant in the next year? No      Contraception Wrap Up   Current Method IUD or IUS    End Method Abstinence    Contraception Counseling Provided No             Examination chaperoned by Freddie Apley RN  Impression and Plan: 1. Encounter for gynecological examination with Papanicolaou smear  of cervix Pap sent Pap in 3 years if normal Physical in 1 year Will get labs fasting in about 1 month - CBC - Comprehensive metabolic panel - Lipid panel - Cytology - PAP( Paden)  2. Encounter for screening fecal occult blood testing Hemoccult was negative  - POCT occult blood stool  3. Encounter for removal of intrauterine contraceptive device (IUD) IUD removed  4. Screening for colorectal cancer Will order cologuard - Cologuard  5. Screening mammogram for breast cancer Pt to call for mammogram appt - MM 3D SCREENING MAMMOGRAM BILATERAL BREAST; Future  6. Menopause Check FSH in 1 month - Follicle stimulating hormone  7. Screening cholesterol level  -  Lipid panel

## 2023-04-19 LAB — CYTOLOGY - PAP
Comment: NEGATIVE
Diagnosis: NEGATIVE
High risk HPV: NEGATIVE

## 2023-05-02 DIAGNOSIS — Z1212 Encounter for screening for malignant neoplasm of rectum: Secondary | ICD-10-CM | POA: Diagnosis not present

## 2023-05-02 DIAGNOSIS — Z1211 Encounter for screening for malignant neoplasm of colon: Secondary | ICD-10-CM | POA: Diagnosis not present

## 2023-05-09 LAB — COLOGUARD: COLOGUARD: NEGATIVE

## 2023-06-02 ENCOUNTER — Ambulatory Visit (HOSPITAL_COMMUNITY)
Admission: RE | Admit: 2023-06-02 | Discharge: 2023-06-02 | Disposition: A | Discharge status: Home / Self Care | Payer: BC Managed Care – PPO | Source: Ambulatory Visit | Attending: Adult Health | Admitting: Adult Health

## 2023-06-02 DIAGNOSIS — Z1231 Encounter for screening mammogram for malignant neoplasm of breast: Secondary | ICD-10-CM | POA: Insufficient documentation

## 2023-06-03 LAB — COMPREHENSIVE METABOLIC PANEL
ALT: 23 [IU]/L (ref 0–32)
AST: 20 [IU]/L (ref 0–40)
Albumin: 4.6 g/dL (ref 3.8–4.9)
Alkaline Phosphatase: 106 [IU]/L (ref 44–121)
BUN/Creatinine Ratio: 15 (ref 9–23)
BUN: 15 mg/dL (ref 6–24)
Bilirubin Total: 0.4 mg/dL (ref 0.0–1.2)
CO2: 24 mmol/L (ref 20–29)
Calcium: 9.9 mg/dL (ref 8.7–10.2)
Chloride: 104 mmol/L (ref 96–106)
Creatinine, Ser: 0.97 mg/dL (ref 0.57–1.00)
Globulin, Total: 2.1 g/dL (ref 1.5–4.5)
Glucose: 91 mg/dL (ref 70–99)
Potassium: 4.8 mmol/L (ref 3.5–5.2)
Sodium: 142 mmol/L (ref 134–144)
Total Protein: 6.7 g/dL (ref 6.0–8.5)
eGFR: 69 mL/min/{1.73_m2} (ref >59)

## 2023-06-03 LAB — LIPID PANEL
Chol/HDL Ratio: 3.2 {ratio} (ref 0.0–4.4)
Cholesterol, Total: 171 mg/dL (ref 100–199)
HDL: 54 mg/dL (ref >39)
LDL Chol Calc (NIH): 91 mg/dL (ref 0–99)
Triglycerides: 150 mg/dL — ABNORMAL HIGH (ref 0–149)
VLDL Cholesterol Cal: 26 mg/dL (ref 5–40)

## 2023-06-03 LAB — CBC
Hematocrit: 43.6 % (ref 34.0–46.6)
Hemoglobin: 14.5 g/dL (ref 11.1–15.9)
MCH: 28.6 pg (ref 26.6–33.0)
MCHC: 33.3 g/dL (ref 31.5–35.7)
MCV: 86 fL (ref 79–97)
Platelets: 199 10*3/uL (ref 150–450)
RBC: 5.07 x10E6/uL (ref 3.77–5.28)
RDW: 13.6 % (ref 11.7–15.4)
WBC: 6.1 10*3/uL (ref 3.4–10.8)

## 2023-06-03 LAB — FOLLICLE STIMULATING HORMONE: FSH: 126 m[IU]/mL

## 2023-11-17 ENCOUNTER — Ambulatory Visit: Admitting: Orthopedic Surgery

## 2023-11-17 ENCOUNTER — Encounter: Payer: Self-pay | Admitting: Orthopedic Surgery

## 2023-11-17 VITALS — BP 107/71 | Ht 67.0 in | Wt 169.0 lb

## 2023-11-17 DIAGNOSIS — M75121 Complete rotator cuff tear or rupture of right shoulder, not specified as traumatic: Secondary | ICD-10-CM

## 2023-11-17 DIAGNOSIS — G8929 Other chronic pain: Secondary | ICD-10-CM | POA: Diagnosis not present

## 2023-11-17 DIAGNOSIS — M720 Palmar fascial fibromatosis [Dupuytren]: Secondary | ICD-10-CM

## 2023-11-17 DIAGNOSIS — M25511 Pain in right shoulder: Secondary | ICD-10-CM

## 2023-11-17 MED ORDER — BETAMETHASONE SOD PHOS & ACET 6 (3-3) MG/ML IJ SUSP
12.0000 mg | Freq: Once | INTRAMUSCULAR | Status: AC
Start: 2023-11-17 — End: 2023-11-17
  Administered 2023-11-17: 12 mg via INTRAMUSCULAR

## 2023-11-17 NOTE — Patient Instructions (Signed)

## 2023-11-17 NOTE — Progress Notes (Unsigned)
 Return patient Visit  Assessment: Krystal [name] is a 58 y.o. female with the following: Right shoulder rotator cuff tear  Plan: Aerial Plunk has a right shoulder rotator cuff tear with retraction.  I think it is amenable to arthroscopic repair.  Procedure and recovery discussed in great detail.  This is not urgent, but I think it can get worse if we wait too long.  She states her understanding.  She probably will not require surgical clearance.  If she wishes to proceed with surgery.  She will contact the clinic.    Follow-up: No follow-ups on file.  Subjective:  Chief Complaint  Patient presents with   Shoulder Pain    Right shoulder pain for 5-6 years she currently takes ibuprofen and ice prn     History of Present Illness: Krystal [name] is a 58 y.o. RHD female who returns for evaluation of right shoulder pain.  This has been ongoing for years.  She still has good function. Steroid injection helped, but is no longer effective.  She is here to discuss her MRI results.    Review of Systems: No fevers or chills No numbness or tingling No chest pain No shortness of breath No bowel or bladder dysfunction No GI distress No headaches   Objective: BP 107/71   Ht 5\' 7"  (1.702 m)   Wt 169 lb (76.7 kg)   LMP  (LMP Unknown) Comment: No periods with IUD in place  BMI 26.47 kg/m   Physical Exam:  General: Alert and oriented. and No acute distress. Gait: Normal gait.  Evaluation of right shoulder demonstrates no deformity.  No atrophy is appreciated.  Near full forward flexion, with some obvious discomfort.  Positive O'Brien's.  Positive Speed's, Negative Yergason's. Tenderness to palpation over the bicipital groove.  Exquisite pain in the empty can testing position.  4/5 strength in the supraspinatus.  5/5 infraspinatus strength.  Mild weakness demonstrated on belly press testing.  Internal rotation to T12.  Abduction to 90 degrees at her side.  Sensations intact throughout her  right upper extremity.  2+ radial pulse.  IMAGING: I personally ordered and reviewed the following images  Right shoulder MRI  Impression:   1. Full-thickness, partial width tear of the supraspinatus tendon with retraction. No muscle atrophy. 2. Mild infraspinatus and subscapularis tendinosis. 3. Mild acromioclavicular osteoarthritis.  New Medications:  Meds ordered this encounter  Medications   betamethasone acetate-betamethasone sodium phosphate (CELESTONE) injection 12 mg      Krystal Frater, MD  11/17/2023 2:47 PM

## 2024-05-15 ENCOUNTER — Encounter: Payer: Self-pay | Admitting: Radiology

## 2024-06-23 ENCOUNTER — Other Ambulatory Visit: Payer: Self-pay | Admitting: General Surgery

## 2024-06-23 DIAGNOSIS — R1011 Right upper quadrant pain: Secondary | ICD-10-CM

## 2024-06-23 NOTE — Progress Notes (Signed)
Right upper quadrant abdominal pain.

## 2024-06-28 ENCOUNTER — Encounter: Payer: Self-pay | Admitting: General Surgery

## 2024-06-28 ENCOUNTER — Ambulatory Visit (HOSPITAL_COMMUNITY): Admission: RE | Admit: 2024-06-28 | Discharge: 2024-06-28 | Attending: General Surgery | Admitting: General Surgery

## 2024-06-28 ENCOUNTER — Ambulatory Visit: Admitting: General Surgery

## 2024-06-28 VITALS — BP 113/78 | HR 75 | Temp 98.1°F | Resp 12 | Ht 67.0 in | Wt 162.0 lb

## 2024-06-28 DIAGNOSIS — K824 Cholesterolosis of gallbladder: Secondary | ICD-10-CM | POA: Diagnosis not present

## 2024-06-28 DIAGNOSIS — R110 Nausea: Secondary | ICD-10-CM | POA: Insufficient documentation

## 2024-06-28 DIAGNOSIS — R1011 Right upper quadrant pain: Secondary | ICD-10-CM | POA: Insufficient documentation

## 2024-06-28 DIAGNOSIS — R1013 Epigastric pain: Secondary | ICD-10-CM | POA: Diagnosis not present

## 2024-06-28 MED ORDER — SUCRALFATE 1 GM/10ML PO SUSP: 1.0000 g | Freq: Three times a day (TID) | ORAL | 0 refills | Status: DC

## 2024-06-28 MED ORDER — ONDANSETRON 4 MG PO TBDP: 4.0000 mg | ORAL_TABLET | Freq: Three times a day (TID) | ORAL | 0 refills | Status: DC | PRN

## 2024-06-28 NOTE — Progress Notes (Unsigned)
 [facility] History and Physical  Reason for Referral:*** Referring Physician: ***  Chief Complaint   New Patient (Initial Visit)     Krystal [name] is a 58 y.o. female.  HPI:   Discussed the use of AI scribe software for clinical note transcription with the patient, who gave verbal consent to proceed.  History of Present Illness      ***.  The *** started *** and has had a duration of ***.  It is associated with ***.  The *** is improved with ***, and is made worse with ***.    Quality*** Context***  Past Medical History:  Diagnosis Date   Abnormal Pap smear of cervix    History of abnormal cervical Pap smear 09/26/2013   Menorrhagia 09/26/2013    Past Surgical History:  Procedure Laterality Date   APPENDECTOMY     COLPOSCOPY VULVA W/ BIOPSY     FOOT SURGERY Bilateral    GASTROSTOMY  1972   patient swallowed a marble and had to get an upper midline celiotomy for gastrostomy to remove the marble and they took her appendix at the same time   TONSILLECTOMY AND ADENOIDECTOMY      Family History  Problem Relation Age of Onset   Cancer Maternal Grandmother        breast   Stroke Mother    Hypertension Mother    Crohn's disease Mother    Cancer Maternal Aunt 37       breast    Social History[1]  Medications: {medication reviewed/display:3041432} Allergies as of 06/28/2024       Reactions   Sulfa Antibiotics Swelling, Rash, Other (See Comments)   blisters        Medication List        Accurate as of June 28, 2024 10:57 AM. If you have any questions, ask your nurse or doctor.          aspirin-acetaminophen-caffeine 250-250-65 MG tablet Commonly known as: EXCEDRIN MIGRAINE Take 1 tablet by mouth every 6 (six) hours as needed for headache.   multivitamin with minerals Tabs tablet Take 1 tablet by mouth daily.   ondansetron  4 MG disintegrating tablet Commonly known as: ZOFRAN -ODT Take 1 tablet (4 mg total) by mouth every 8  (eight) hours as needed for nausea or vomiting. Started by: Manuelita Pander, MD   pantoprazole  40 MG tablet Commonly known as: Protonix  Take 1 tablet (40 mg total) by mouth 2 (two) times daily. Started by: Manuelita Pander, MD   sucralfate  1 GM/10ML suspension Commonly known as: Carafate  Take 10 mLs (1 g total) by mouth 4 (four) times daily -  with meals and at bedtime. Started by: Manuelita Pander, MD         ROS:  {Review of Systems:30496}  Blood pressure 113/78, pulse 75, temperature 98.1 F (36.7 C), temperature source Oral, resp. rate 12, height 5' 7 (1.702 m), weight 162 lb (73.5 kg), SpO2 98%. Physical Exam Physical Exam   Results: No results found for this or any previous visit (from the past 48 hours).  No results found.   Assessment and Plan: Assessment and Plan Assessment & Plan      Krystal [name] is a 58 y.o. female with *** -*** -*** -Follow up ***  All questions were answered to the satisfaction of the patient and family***.  The risk and benefits of *** were discussed including but not limited to ***.  After careful consideration, Krystal [name] has decided to ***.  Manuelita JAYSON Pander 06/28/2024, 10:57 AM          [1]  Social History Tobacco Use   Smoking status: Never   Smokeless tobacco: Never  Vaping Use   Vaping status: Never Used  Substance Use Topics   Alcohol use: Yes    Comment: occasionally   Drug use: No

## 2024-06-28 NOTE — Patient Instructions (Addendum)
 Pay attention to your pain with your intake and see how food effects your symptoms.  If your pain is worsening, let us  know. You may end up and need your  gallbladder removed but I your symptoms are not classic. We have referred you to GI, Dr. Cindie, for possible endoscopy.   I have sent in Protonix  and Carafate  for GI symptoms. Zofran  was sent in for your nausea.

## 2024-06-29 ENCOUNTER — Telehealth: Payer: Self-pay | Admitting: Gastroenterology

## 2024-06-29 ENCOUNTER — Telehealth: Payer: Self-pay | Admitting: General Surgery

## 2024-06-29 ENCOUNTER — Encounter: Payer: Self-pay | Admitting: *Deleted

## 2024-06-29 ENCOUNTER — Telehealth: Payer: Self-pay | Admitting: *Deleted

## 2024-06-29 MED ORDER — PANTOPRAZOLE SODIUM 40 MG PO TBEC: 40.0000 mg | DELAYED_RELEASE_TABLET | Freq: Two times a day (BID) | ORAL | 1 refills | Status: DC

## 2024-06-29 MED ORDER — ONDANSETRON 4 MG PO TBDP: 4.0000 mg | ORAL_TABLET | Freq: Three times a day (TID) | ORAL | 0 refills | Status: AC | PRN

## 2024-06-29 MED ORDER — SUCRALFATE 1 GM/10ML PO SUSP: 1.0000 g | Freq: Three times a day (TID) | ORAL | 0 refills | Status: AC

## 2024-06-29 NOTE — Telephone Encounter (Signed)
 Patient added and EGD scheduled. Instructions sent via mychart

## 2024-06-29 NOTE — Telephone Encounter (Signed)
 Med sent to [facility] per patient request.  Manuelita Pander, MD

## 2024-06-29 NOTE — Telephone Encounter (Signed)
 Received call from Montie Seltzer, patient's sister. She is trying to get patient moved up sooner to be seen as she is off through end of December.   I will see her on December 22nd at 815. Patient aware.  EGD tentatively planned for 12/23, arrival 9:15, procedure 1045.

## 2024-06-29 NOTE — Telephone Encounter (Signed)
 Called [employer] (# on back of insurance card). PA approved for EGD Auth# [id number], DOS: 06/29/24-10/02/24

## 2024-07-03 ENCOUNTER — Ambulatory Visit: Admitting: Gastroenterology

## 2024-07-03 ENCOUNTER — Encounter: Payer: Self-pay | Admitting: Gastroenterology

## 2024-07-03 VITALS — BP 115/81 | HR 96 | Temp 97.8°F | Ht 67.0 in | Wt 160.4 lb

## 2024-07-03 DIAGNOSIS — R1013 Epigastric pain: Secondary | ICD-10-CM

## 2024-07-03 DIAGNOSIS — K219 Gastro-esophageal reflux disease without esophagitis: Secondary | ICD-10-CM

## 2024-07-03 DIAGNOSIS — R1310 Dysphagia, unspecified: Secondary | ICD-10-CM | POA: Insufficient documentation

## 2024-07-03 MED ORDER — SUCRALFATE 1 GM/10ML PO SUSP: 1.0000 g | Freq: Four times a day (QID) | ORAL | 1 refills | Status: AC

## 2024-07-03 MED ORDER — PANTOPRAZOLE SODIUM 40 MG PO TBEC: 40.0000 mg | DELAYED_RELEASE_TABLET | Freq: Two times a day (BID) | ORAL | 3 refills | Status: AC

## 2024-07-03 NOTE — Progress Notes (Signed)
 "   Gastroenterology Office Note    Referring Provider: Dr. Kallie Primary Care Physician:  Sheryle Carwin, MD  Primary GI: Dr Cindie    Chief Complaint   Chief Complaint  Patient presents with   Abdominal Pain    Pain in mid chest area after she eats. Sometimes vomiting looks like acid, no food.      History of Present Illness   Krystal [name] is a 58 y.o. female presenting today at the request of Dr. Kallie due to abdominal pain. She was seen as outpatient last week by Dr. Kallie for persistent epigastric pain and also noted to have gallbladder polyps on US  12/17. Discussed consideration of cholecystectomy but here to discuss EGD first.   For last 6 months after eating has been vomiting acid that was intermittent, now worsening. Does not regurgitate food. 2 weeks ago had eaten mexican food. Felt sick again. Felt like everything sitting in mid chest area. Ate a salad and woke up in middle of night vomiting acid.   With eating or drinking anything, starts burping everything. Now just eating crackers and water. Feels like food sits mid chest. Feels like a golf ball lodged. No abdominal pain. Denies nausea. Just regurgitating acid.   Dr Kallie had sent in pantoprazole  BID last week. Zofran  on hand but not taking. Prescribed Carafate . Not able to pick it up yet. Insurance issues. Rare reflux in the past. Has not been on PPI in the past. No NSAIDs.   No melena or hematochezia. 10 lbs down in last 2 weeks. In 2010 had similar issues but resolved on own.     No prior EGD or colonoscopy.  Cologuard last year 2024 negative.    Past Medical History:  Diagnosis Date   Abnormal Pap smear of cervix    History of abnormal cervical Pap smear 09/26/2013   Menorrhagia 09/26/2013    Past Surgical History:  Procedure Laterality Date   APPENDECTOMY     COLPOSCOPY VULVA W/ BIOPSY     FOOT SURGERY Bilateral    GASTROSTOMY  1972   patient swallowed a marble and had to get an upper midline  celiotomy for gastrostomy to remove the marble and they took her appendix at the same time   TONSILLECTOMY AND ADENOIDECTOMY      Current Outpatient Medications  Medication Sig Dispense Refill   aspirin-acetaminophen-caffeine (EXCEDRIN MIGRAINE) 250-250-65 MG per tablet Take 1 tablet by mouth every 6 (six) hours as needed for headache.     Multiple Vitamin (MULITIVITAMIN WITH MINERALS) TABS Take 1 tablet by mouth daily.     ondansetron  (ZOFRAN -ODT) 4 MG disintegrating tablet Take 1 tablet (4 mg total) by mouth every 8 (eight) hours as needed for nausea or vomiting. 20 tablet 0   pantoprazole  (PROTONIX ) 40 MG tablet Take 1 tablet (40 mg total) by mouth 2 (two) times daily. (Patient taking differently: Take 40 mg by mouth 2 (two) times daily. Not taking yet) 60 tablet 1   sucralfate  (CARAFATE ) 1 GM/10ML suspension Take 10 mLs (1 g total) by mouth 4 (four) times daily -  with meals and at bedtime. (Patient taking differently: Take 1 g by mouth 4 (four) times daily -  with meals and at bedtime. Not taking yet) 420 mL 0   No current facility-administered medications for this visit.    Allergies as of 07/03/2024 - Review Complete 07/03/2024  Allergen Reaction Noted   Sulfa antibiotics Swelling, Rash, and Other (See Comments) 08/07/2011    Family  History  Problem Relation Age of Onset   Cancer Maternal Grandmother        breast   Stroke Mother    Hypertension Mother    Crohn's disease Mother    Cancer Maternal Aunt 57       breast    Social History   Socioeconomic History   Marital status: Divorced    Spouse name: Not on file   Number of children: 0   Years of education: Not on file   Highest education level: Not on file  Occupational History   Not on file  Tobacco Use   Smoking status: Never   Smokeless tobacco: Never  Vaping Use   Vaping status: Never Used  Substance and Sexual Activity   Alcohol use: Yes    Comment: occasionally   Drug use: No   Sexual activity: Yes     Partners: Male    Birth control/protection: None, Abstinence, Post-menopausal  Other Topics Concern   Not on file  Social History Narrative   Not on file   Social Drivers of Health   Tobacco Use: Low Risk (07/03/2024)   Patient History    Smoking Tobacco Use: Never    Smokeless Tobacco Use: Never    Passive Exposure: Not on file  Financial Resource Strain: Patient Declined (04/14/2023)   Overall Financial Resource Strain (CARDIA)    Difficulty of Paying Living Expenses: Patient declined  Food Insecurity: Patient Declined (04/14/2023)   Hunger Vital Sign    Worried About Running Out of Food in the Last Year: Patient declined    Ran Out of Food in the Last Year: Patient declined  Transportation Needs: Patient Declined (04/14/2023)   PRAPARE - Administrator, Civil Service (Medical): Patient declined    Lack of Transportation (Non-Medical): Patient declined  Physical Activity: Insufficiently Active (04/14/2023)   Exercise Vital Sign    Days of Exercise per Week: 3 days    Minutes of Exercise per Session: 30 min  Stress: Patient Declined (04/14/2023)   [employer] of Occupational Health - Occupational Stress Questionnaire    Feeling of Stress : Patient declined  Social Connections: Patient Declined (04/14/2023)   Social Connection and Isolation Panel    Frequency of Communication with Friends and Family: Patient declined    Frequency of Social Gatherings with Friends and Family: Patient declined    Attends Religious Services: Patient declined    Active Member of Clubs or Organizations: Patient declined    Attends Banker Meetings: Patient declined    Marital Status: Patient declined  Intimate Partner Violence: Patient Declined (04/14/2023)   Humiliation, Afraid, Rape, and Kick questionnaire    Fear of Current or Ex-Partner: Patient declined    Emotionally Abused: Patient declined    Physically Abused: Patient declined    Sexually Abused: Patient  declined  Depression (PHQ2-9): High Risk (04/14/2023)   Depression (PHQ2-9)    PHQ-2 Score: 12  Alcohol Screen: Low Risk (04/14/2023)   Alcohol Screen    Last Alcohol Screening Score (AUDIT): 4  Housing: Low Risk (04/14/2023)   Housing    Last Housing Risk Score: 0  Utilities: At Risk (04/14/2023)   AHC Utilities    Threatened with loss of utilities: Yes  Health Literacy: Not on file     Review of Systems   Gen: Denies any fever, chills, fatigue, weight loss, lack of appetite.  CV: Denies chest pain, heart palpitations, peripheral edema, syncope.  Resp: Denies shortness of breath at  rest or with exertion. Denies wheezing or cough.  GI: Denies dysphagia or odynophagia. Denies jaundice, hematemesis, fecal incontinence. GU : Denies urinary burning, urinary frequency, urinary hesitancy MS: Denies joint pain, muscle weakness, cramps, or limitation of movement.  Derm: Denies rash, itching, dry skin Psych: Denies depression, anxiety, memory loss, and confusion Heme: Denies bruising, bleeding, and enlarged lymph nodes.   Physical Exam   BP 115/81 (BP Location: Right Arm, Patient Position: Sitting, Cuff Size: Normal)   Pulse 96   Temp 97.8 F (36.6 C) (Temporal)   Ht 5' 7 (1.702 m)   Wt 160 lb 6.4 oz (72.8 kg)   LMP  (LMP Unknown) Comment: No periods with IUD in place  BMI 25.12 kg/m  General:   Alert and oriented. Pleasant and cooperative. Well-nourished and well-developed.  Head:  Normocephalic and atraumatic. Eyes:  Without icterus Ears:  Normal auditory acuity. Lungs:  Clear to auscultation bilaterally.  Heart:  S1, S2 present without murmurs appreciated.  Abdomen:  +BS, soft, non-tender and non-distended. No HSM noted. No guarding or rebound. No masses appreciated.  Rectal:  Deferred  Msk:  Symmetrical without gross deformities. Normal posture. Extremities:  Without edema. Neurologic:  Alert and  oriented x4;  grossly normal neurologically. Skin:  Intact without  significant lesions or rashes. Psych:  Alert and cooperative. Normal mood and affect.   Assessment   Krystal [name] is a 58 y.o. female presenting today with 6 month history of dyspepsia now worsening, associated worsening GERD, no esophageal dysphagia but does have sensation of food sitting heavy in upper abdomen.   Suspect dealing with uncontrolled GERD and could have esophagitis, stricture, etc. Less likely biliary etiology. Doubt malignancy.  She will undergo EGD and then consider cholecystectomy by Dr. Kallie if EGD unrevealing.  I have asked she go ahead and start pantoprazole  BID and carafate ; I sent this again to the pharmacy, as she was unable to pick this up last week.    PLAN   Start pantoprazole  BID, carafate  QID  EGD with Dr. Cindie, possible dilation if needed  May need cholecystectomy in future but will await EGD findings     Therisa MICAEL Stager, PhD, ANP-BC [facility]    "

## 2024-07-03 NOTE — Patient Instructions (Signed)
 You have been scheduled for an upper endoscopy with possible dilation by Dr. Cindie on 12/23.   I recommend picking up the pantoprazole  to take twice a day and the carafate  suspension to take four times a day. I sent this to [facility]. Please reach out on myChart or have cynthia text me if difficult getting this!  Have a wonderful Christmas!  It was a pleasure to see you today. I want to create trusting relationships with patients and provide genuine, compassionate, and quality care. I truly value your feedback, so please be on the lookout for a survey regarding your visit with me today. I appreciate your time in completing this!         Therisa MICAEL Stager, PhD, ANP-BC [facility]

## 2024-07-03 NOTE — H&P (View-Only) (Signed)
 "   Gastroenterology Office Note    Referring Provider: Dr. Kallie Primary Care Physician:  Sheryle Carwin, MD  Primary GI: Dr Cindie    Chief Complaint   Chief Complaint  Patient presents with   Abdominal Pain    Pain in mid chest area after she eats. Sometimes vomiting looks like acid, no food.      History of Present Illness   Krystal [name] is a 58 y.o. female presenting today at the request of Dr. Kallie due to abdominal pain. She was seen as outpatient last week by Dr. Kallie for persistent epigastric pain and also noted to have gallbladder polyps on US  12/17. Discussed consideration of cholecystectomy but here to discuss EGD first.   For last 6 months after eating has been vomiting acid that was intermittent, now worsening. Does not regurgitate food. 2 weeks ago had eaten mexican food. Felt sick again. Felt like everything sitting in mid chest area. Ate a salad and woke up in middle of night vomiting acid.   With eating or drinking anything, starts burping everything. Now just eating crackers and water. Feels like food sits mid chest. Feels like a golf ball lodged. No abdominal pain. Denies nausea. Just regurgitating acid.   Dr Kallie had sent in pantoprazole  BID last week. Zofran  on hand but not taking. Prescribed Carafate . Not able to pick it up yet. Insurance issues. Rare reflux in the past. Has not been on PPI in the past. No NSAIDs.   No melena or hematochezia. 10 lbs down in last 2 weeks. In 2010 had similar issues but resolved on own.     No prior EGD or colonoscopy.  Cologuard last year 2024 negative.    Past Medical History:  Diagnosis Date   Abnormal Pap smear of cervix    History of abnormal cervical Pap smear 09/26/2013   Menorrhagia 09/26/2013    Past Surgical History:  Procedure Laterality Date   APPENDECTOMY     COLPOSCOPY VULVA W/ BIOPSY     FOOT SURGERY Bilateral    GASTROSTOMY  1972   patient swallowed a marble and had to get an upper midline  celiotomy for gastrostomy to remove the marble and they took her appendix at the same time   TONSILLECTOMY AND ADENOIDECTOMY      Current Outpatient Medications  Medication Sig Dispense Refill   aspirin-acetaminophen-caffeine (EXCEDRIN MIGRAINE) 250-250-65 MG per tablet Take 1 tablet by mouth every 6 (six) hours as needed for headache.     Multiple Vitamin (MULITIVITAMIN WITH MINERALS) TABS Take 1 tablet by mouth daily.     ondansetron  (ZOFRAN -ODT) 4 MG disintegrating tablet Take 1 tablet (4 mg total) by mouth every 8 (eight) hours as needed for nausea or vomiting. 20 tablet 0   pantoprazole  (PROTONIX ) 40 MG tablet Take 1 tablet (40 mg total) by mouth 2 (two) times daily. (Patient taking differently: Take 40 mg by mouth 2 (two) times daily. Not taking yet) 60 tablet 1   sucralfate  (CARAFATE ) 1 GM/10ML suspension Take 10 mLs (1 g total) by mouth 4 (four) times daily -  with meals and at bedtime. (Patient taking differently: Take 1 g by mouth 4 (four) times daily -  with meals and at bedtime. Not taking yet) 420 mL 0   No current facility-administered medications for this visit.    Allergies as of 07/03/2024 - Review Complete 07/03/2024  Allergen Reaction Noted   Sulfa antibiotics Swelling, Rash, and Other (See Comments) 08/07/2011    Family  History  Problem Relation Age of Onset   Cancer Maternal Grandmother        breast   Stroke Mother    Hypertension Mother    Crohn's disease Mother    Cancer Maternal Aunt 57       breast    Social History   Socioeconomic History   Marital status: Divorced    Spouse name: Not on file   Number of children: 0   Years of education: Not on file   Highest education level: Not on file  Occupational History   Not on file  Tobacco Use   Smoking status: Never   Smokeless tobacco: Never  Vaping Use   Vaping status: Never Used  Substance and Sexual Activity   Alcohol use: Yes    Comment: occasionally   Drug use: No   Sexual activity: Yes     Partners: Male    Birth control/protection: None, Abstinence, Post-menopausal  Other Topics Concern   Not on file  Social History Narrative   Not on file   Social Drivers of Health   Tobacco Use: Low Risk (07/03/2024)   Patient History    Smoking Tobacco Use: Never    Smokeless Tobacco Use: Never    Passive Exposure: Not on file  Financial Resource Strain: Patient Declined (04/14/2023)   Overall Financial Resource Strain (CARDIA)    Difficulty of Paying Living Expenses: Patient declined  Food Insecurity: Patient Declined (04/14/2023)   Hunger Vital Sign    Worried About Running Out of Food in the Last Year: Patient declined    Ran Out of Food in the Last Year: Patient declined  Transportation Needs: Patient Declined (04/14/2023)   PRAPARE - Administrator, Civil Service (Medical): Patient declined    Lack of Transportation (Non-Medical): Patient declined  Physical Activity: Insufficiently Active (04/14/2023)   Exercise Vital Sign    Days of Exercise per Week: 3 days    Minutes of Exercise per Session: 30 min  Stress: Patient Declined (04/14/2023)   [employer] of Occupational Health - Occupational Stress Questionnaire    Feeling of Stress : Patient declined  Social Connections: Patient Declined (04/14/2023)   Social Connection and Isolation Panel    Frequency of Communication with Friends and Family: Patient declined    Frequency of Social Gatherings with Friends and Family: Patient declined    Attends Religious Services: Patient declined    Active Member of Clubs or Organizations: Patient declined    Attends Banker Meetings: Patient declined    Marital Status: Patient declined  Intimate Partner Violence: Patient Declined (04/14/2023)   Humiliation, Afraid, Rape, and Kick questionnaire    Fear of Current or Ex-Partner: Patient declined    Emotionally Abused: Patient declined    Physically Abused: Patient declined    Sexually Abused: Patient  declined  Depression (PHQ2-9): High Risk (04/14/2023)   Depression (PHQ2-9)    PHQ-2 Score: 12  Alcohol Screen: Low Risk (04/14/2023)   Alcohol Screen    Last Alcohol Screening Score (AUDIT): 4  Housing: Low Risk (04/14/2023)   Housing    Last Housing Risk Score: 0  Utilities: At Risk (04/14/2023)   AHC Utilities    Threatened with loss of utilities: Yes  Health Literacy: Not on file     Review of Systems   Gen: Denies any fever, chills, fatigue, weight loss, lack of appetite.  CV: Denies chest pain, heart palpitations, peripheral edema, syncope.  Resp: Denies shortness of breath at  rest or with exertion. Denies wheezing or cough.  GI: Denies dysphagia or odynophagia. Denies jaundice, hematemesis, fecal incontinence. GU : Denies urinary burning, urinary frequency, urinary hesitancy MS: Denies joint pain, muscle weakness, cramps, or limitation of movement.  Derm: Denies rash, itching, dry skin Psych: Denies depression, anxiety, memory loss, and confusion Heme: Denies bruising, bleeding, and enlarged lymph nodes.   Physical Exam   BP 115/81 (BP Location: Right Arm, Patient Position: Sitting, Cuff Size: Normal)   Pulse 96   Temp 97.8 F (36.6 C) (Temporal)   Ht 5' 7 (1.702 m)   Wt 160 lb 6.4 oz (72.8 kg)   LMP  (LMP Unknown) Comment: No periods with IUD in place  BMI 25.12 kg/m  General:   Alert and oriented. Pleasant and cooperative. Well-nourished and well-developed.  Head:  Normocephalic and atraumatic. Eyes:  Without icterus Ears:  Normal auditory acuity. Lungs:  Clear to auscultation bilaterally.  Heart:  S1, S2 present without murmurs appreciated.  Abdomen:  +BS, soft, non-tender and non-distended. No HSM noted. No guarding or rebound. No masses appreciated.  Rectal:  Deferred  Msk:  Symmetrical without gross deformities. Normal posture. Extremities:  Without edema. Neurologic:  Alert and  oriented x4;  grossly normal neurologically. Skin:  Intact without  significant lesions or rashes. Psych:  Alert and cooperative. Normal mood and affect.   Assessment   Krystal [name] is a 58 y.o. female presenting today with 6 month history of dyspepsia now worsening, associated worsening GERD, no esophageal dysphagia but does have sensation of food sitting heavy in upper abdomen.   Suspect dealing with uncontrolled GERD and could have esophagitis, stricture, etc. Less likely biliary etiology. Doubt malignancy.  She will undergo EGD and then consider cholecystectomy by Dr. Kallie if EGD unrevealing.  I have asked she go ahead and start pantoprazole  BID and carafate ; I sent this again to the pharmacy, as she was unable to pick this up last week.    PLAN   Start pantoprazole  BID, carafate  QID  EGD with Dr. Cindie, possible dilation if needed  May need cholecystectomy in future but will await EGD findings     Therisa MICAEL Stager, PhD, ANP-BC [facility]    "

## 2024-07-04 ENCOUNTER — Other Ambulatory Visit: Payer: Self-pay

## 2024-07-04 ENCOUNTER — Encounter (HOSPITAL_COMMUNITY): Payer: Self-pay | Admitting: Internal Medicine

## 2024-07-04 ENCOUNTER — Encounter (HOSPITAL_COMMUNITY): Payer: Self-pay | Admitting: Anesthesiology

## 2024-07-04 ENCOUNTER — Ambulatory Visit (HOSPITAL_COMMUNITY)
Admission: RE | Admit: 2024-07-04 | Discharge: 2024-07-04 | Disposition: A | Discharge status: Home / Self Care | Attending: Internal Medicine | Admitting: Internal Medicine

## 2024-07-04 ENCOUNTER — Encounter (HOSPITAL_COMMUNITY): Admission: RE | Disposition: A | Payer: Self-pay | Source: Home / Self Care | Attending: Internal Medicine

## 2024-07-04 ENCOUNTER — Ambulatory Visit (HOSPITAL_COMMUNITY): Payer: Self-pay | Admitting: Anesthesiology

## 2024-07-04 DIAGNOSIS — K824 Cholesterolosis of gallbladder: Secondary | ICD-10-CM | POA: Insufficient documentation

## 2024-07-04 DIAGNOSIS — R1310 Dysphagia, unspecified: Secondary | ICD-10-CM | POA: Insufficient documentation

## 2024-07-04 DIAGNOSIS — K3189 Other diseases of stomach and duodenum: Secondary | ICD-10-CM | POA: Insufficient documentation

## 2024-07-04 DIAGNOSIS — K219 Gastro-esophageal reflux disease without esophagitis: Secondary | ICD-10-CM | POA: Insufficient documentation

## 2024-07-04 DIAGNOSIS — R1013 Epigastric pain: Secondary | ICD-10-CM | POA: Insufficient documentation

## 2024-07-04 DIAGNOSIS — K2289 Other specified disease of esophagus: Secondary | ICD-10-CM | POA: Diagnosis not present

## 2024-07-04 DIAGNOSIS — Z79899 Other long term (current) drug therapy: Secondary | ICD-10-CM | POA: Insufficient documentation

## 2024-07-04 DIAGNOSIS — K312 Hourglass stricture and stenosis of stomach: Secondary | ICD-10-CM | POA: Diagnosis not present

## 2024-07-04 DIAGNOSIS — K449 Diaphragmatic hernia without obstruction or gangrene: Secondary | ICD-10-CM | POA: Diagnosis not present

## 2024-07-04 DIAGNOSIS — K2950 Unspecified chronic gastritis without bleeding: Secondary | ICD-10-CM | POA: Diagnosis not present

## 2024-07-04 DIAGNOSIS — K222 Esophageal obstruction: Secondary | ICD-10-CM | POA: Diagnosis not present

## 2024-07-04 HISTORY — DX: Gastro-esophageal reflux disease without esophagitis: K21.9

## 2024-07-04 HISTORY — PX: ESOPHAGOGASTRODUODENOSCOPY: SHX5428

## 2024-07-04 LAB — KOH PREP: KOH Prep: NONE SEEN

## 2024-07-04 SURGERY — EGD (ESOPHAGOGASTRODUODENOSCOPY)
Anesthesia: Monitor Anesthesia Care

## 2024-07-04 MED ORDER — LIDOCAINE 2% (20 MG/ML) 5 ML SYRINGE
INTRAMUSCULAR | Status: DC | PRN
  Administered 2024-07-04: 60 mg via INTRAVENOUS

## 2024-07-04 MED ORDER — PROPOFOL 10 MG/ML IV BOLUS
INTRAVENOUS | Status: DC | PRN
  Administered 2024-07-04: 40 mg via INTRAVENOUS
  Administered 2024-07-04: 50 mg via INTRAVENOUS
  Administered 2024-07-04: 100 mg via INTRAVENOUS
  Administered 2024-07-04: 30 mg via INTRAVENOUS
  Administered 2024-07-04: 50 mg via INTRAVENOUS

## 2024-07-04 MED ORDER — LACTATED RINGERS IV SOLN: INTRAVENOUS | Status: DC

## 2024-07-04 NOTE — Anesthesia Preprocedure Evaluation (Addendum)
"                                    Anesthesia Evaluation  Patient identified by MRN, date of birth, ID band Patient awake    Reviewed: Allergy & Precautions, H&P , NPO status , Patient's Chart, lab work & pertinent test results, reviewed documented beta blocker date and time   Airway Mallampati: II  TM Distance: >3 FB Neck ROM: full    Dental no notable dental hx.    Pulmonary neg pulmonary ROS   Pulmonary exam normal breath sounds clear to auscultation       Cardiovascular Exercise Tolerance: Good hypertension, negative cardio ROS  Rhythm:regular Rate:Normal     Neuro/Psych negative neurological ROS  negative psych ROS   GI/Hepatic negative GI ROS, Neg liver ROS,GERD  ,,  Endo/Other  negative endocrine ROS    Renal/GU negative Renal ROS  negative genitourinary   Musculoskeletal   Abdominal   Peds  Hematology negative hematology ROS (+)   Anesthesia Other Findings   Reproductive/Obstetrics negative OB ROS                              Anesthesia Physical Anesthesia Plan  ASA: 2  Anesthesia Plan: MAC   Post-op Pain Management:    Induction:   PONV Risk Score and Plan: Propofol  infusion  Airway Management Planned:   Additional Equipment:   Intra-op Plan:   Post-operative Plan:   Informed Consent: I have reviewed the patients History and Physical, chart, labs and discussed the procedure including the risks, benefits and alternatives for the proposed anesthesia with the patient or authorized representative who has indicated his/her understanding and acceptance.     Dental Advisory Given  Plan Discussed with: CRNA  Anesthesia Plan Comments:          Anesthesia Quick Evaluation  "

## 2024-07-04 NOTE — Discharge Instructions (Addendum)
 EGD Discharge instructions Please read the instructions outlined below and refer to this sheet in the next few weeks. These discharge instructions provide you with general information on caring for yourself after you leave the hospital. Your doctor may also give you specific instructions. While your treatment has been planned according to the most current medical practices available, unavoidable complications occasionally occur. If you have any problems or questions after discharge, please call your doctor. ACTIVITY You may resume your regular activity but move at a slower pace for the next 24 hours.  Take frequent rest periods for the next 24 hours.  Walking will help expel (get rid of) the air and reduce the bloated feeling in your abdomen.  No driving for 24 hours (because of the anesthesia (medicine) used during the test).  You may shower.  Do not sign any important legal documents or operate any machinery for 24 hours (because of the anesthesia used during the test).  NUTRITION Drink plenty of fluids.  You may resume your normal diet.  Begin with a light meal and progress to your normal diet.  Avoid alcoholic beverages for 24 hours or as instructed by your caregiver.  MEDICATIONS You may resume your normal medications unless your caregiver tells you otherwise.  WHAT YOU CAN EXPECT TODAY You may experience abdominal discomfort such as a feeling of fullness or gas pains.  FOLLOW-UP Your doctor will discuss the results of your test with you.  SEEK IMMEDIATE MEDICAL ATTENTION IF ANY OF THE FOLLOWING OCCUR: Excessive nausea (feeling sick to your stomach) and/or vomiting.  Severe abdominal pain and distention (swelling).  Trouble swallowing.  Temperature over 101 F (37.8 C).  Rectal bleeding or vomiting of blood.   Your upper endoscopy revealed evidence of candidal esophagitis.  This is a yeast infection.  I took samples today.  If positive we will treat with 14-day course of  fluconazole.  You also had a small hiatal hernia and a tightening of your esophagus called a Schatzki's ring.  This is a benign ring related to acid reflux.  I stretched this out today.  Hopefully this helps with feeling of food getting stuck.  I noted mild amount inflammation in your stomach.  I took biopsies of this to rule out infection with a bacteria called H. pylori.  Await pathology results, my office will contact you.  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  I was unable to pass endoscope through this.  I dilated the stricture as well and was able to pass scope through afterwards and complete your exam.  Continue on pantoprazole  twice daily.  Follow-up in GI office in 4 weeks.  We may need to perform future dilations.   I hope you have a great rest of your week!  Carlin POUR. Cindie, D.O. Gastroenterology and Hepatology [facility]

## 2024-07-04 NOTE — Interval H&P Note (Signed)
 History and Physical Interval Note:  07/04/2024 10:37 AM  Krystal [name]  has presented today for surgery, with the diagnosis of EPIGASTRIC PAIN.  The various methods of treatment have been discussed with the patient and family. After consideration of risks, benefits and other options for treatment, the patient has consented to  Procedures with comments: EGD (ESOPHAGOGASTRODUODENOSCOPY) (N/A) - 10:45AM, ASA 2 as a surgical intervention.  The patient's history has been reviewed, patient examined, no change in status, stable for surgery.  I have reviewed the patient's chart and labs.  Questions were answered to the patient's satisfaction.     Krystal [name]

## 2024-07-04 NOTE — Transfer of Care (Signed)
 Immediate Anesthesia Transfer of Care Note  Patient: Krystal [name]  Procedure(s) Performed: EGD (ESOPHAGOGASTRODUODENOSCOPY) Balloon dilation wire-guided  Patient Location: Endoscopy Unit  Anesthesia Type:General  Level of Consciousness: awake  Airway & Oxygen Therapy: Patient Spontanous Breathing  Post-op Assessment: Report given to RN and Post -op Vital signs reviewed and stable  Post vital signs: Reviewed and stable  Last Vitals:  Vitals Value Taken Time  BP 82/58 07/04/24 11:00  Temp 36.8 C 07/04/24 11:00  Pulse 110 07/04/24 11:00  Resp 25 07/04/24 11:00  SpO2 93 % 07/04/24 11:00    Last Pain:  Vitals:   07/04/24 1100  TempSrc: Oral      Patients Stated Pain Goal: 3 (07/04/24 0924)  Complications: No notable events documented.

## 2024-07-04 NOTE — Op Note (Signed)
 [facility] [name] Procedure Date: 07/04/2024 10:40 AM MRN: [id number] Date of Birth: 12/01/1965 Attending MD: Krystal [name] , [place], [id number] CSN: [id number] Age: 58 Admit Type: Outpatient Procedure:                Upper GI endoscopy Indications:              Epigastric abdominal pain, Dysphagia Providers:                Krystal POUR. Cindie, DO, Jon LABOR. Gerome RN, RN,                            Dorcas Lenis, Technician Referring MD:              Medicines:                See the Anesthesia note for documentation of the                            administered medications Complications:            No immediate complications. Estimated Blood Loss:     Estimated blood loss was minimal. Procedure:                Pre-Anesthesia Assessment:                           - The anesthesia plan was to use monitored                            anesthesia care (MAC).                           After obtaining informed consent, the endoscope was                            passed under direct vision. Throughout the                            procedure, the patient's blood pressure, pulse, and                            oxygen saturations were monitored continuously. The                            [id number] ([id number]) Upper was introduced through                            the mouth, and advanced to the second part of                            duodenum. The upper GI endoscopy was accomplished                            without difficulty. The patient tolerated the                            procedure well.  Scope In: 10:47:18 AM Scope Out: 10:57:24 AM Total Procedure Duration: 0 hours 10 minutes 6 seconds  Findings:      White nummular lesions were noted in the entire esophagus. Cells for       cytology were obtained by brushing.      A small hiatal hernia was present.      A mild Schatzki ring was found in the distal esophagus. A TTS dilator       was passed through the  scope. Dilation with an 18-19-20 mm balloon       dilator was performed to 20 mm. The dilation site was examined and       showed moderate improvement in luminal narrowing.      Patchy mild inflammation was found in the entire examined stomach.       Biopsies were taken with a cold forceps for Helicobacter pylori testing.      A benign-appearing, intrinsic moderate stenosis was found in the gastric       antrum just proximal to pylorus. This was unable to be traversed with       endoscope. A TTS dilator was passed through the scope. Dilation with a       04-23-11 mm pyloric balloon dilator was performed to 10 mm. The dilation       site was examined and showed mild mucosal disruption and moderate       improvement in luminal narrowing. I was able to pass endoscope after       dilation.      The duodenal bulb, first portion of the duodenum and second portion of       the duodenum were normal. Impression:               - White nummular lesions in esophageal mucosa.                            Cells for cytology obtained.                           - Small hiatal hernia.                           - Mild Schatzki ring. Dilated.                           - Gastritis. Biopsied.                           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                            Dilated.                           - Normal duodenal bulb, first portion of the                            duodenum and second portion of the duodenum. Moderate Sedation:      Per Anesthesia Care Recommendation:           - Patient has a contact number available for  emergencies. The signs and symptoms of potential                            delayed complications were discussed with the                            patient. Return to normal activities tomorrow.                            Written discharge instructions were provided to the                            patient.                            - Resume previous diet.                           - Continue present medications.                           - Await pathology results.                           - Repeat upper endoscopy PRN for retreatment.                           - Return to GI clinic in 4 weeks.                           - Treat for candidal esophagitis if cytology                            positive.                           - Use Protonix  (pantoprazole ) 40 mg PO BID. Procedure Code(s):        --- Professional ---                           [phone], Esophagogastroduodenoscopy, flexible,                            transoral; with dilation of gastric/duodenal                            stricture(s) (eg, balloon, bougie)                           43249, Esophagogastroduodenoscopy, flexible,                            transoral; with transendoscopic balloon dilation of                            esophagus (less than 30 mm diameter)  56760, 59, Esophagogastroduodenoscopy, flexible,                            transoral; with biopsy, single or multiple Diagnosis Code(s):        --- Professional ---                           K22.89, Other specified disease of esophagus                           K44.9, Diaphragmatic hernia without obstruction or                            gangrene                           K22.2, Esophageal obstruction                           K29.70, Gastritis, unspecified, without bleeding                           K31.89, Other diseases of stomach and duodenum                           R10.13, Epigastric pain                           R13.10, Dysphagia, unspecified CPT copyright 2022 [employer]. All rights reserved. The codes documented in this report are preliminary and upon coder review may  be revised to meet current compliance requirements. Krystal POUR. Cindie, DO Krystal POUR. Cindie, DO 07/04/2024 11:05:30 AM This report has been signed  electronically. Number of Addenda: 0

## 2024-07-05 LAB — SURGICAL PATHOLOGY

## 2024-07-07 NOTE — Anesthesia Postprocedure Evaluation (Signed)
"   Anesthesia Post Note  Patient: SHYLOH DEROSA  Procedure(s) Performed: EGD (ESOPHAGOGASTRODUODENOSCOPY) Balloon dilation wire-guided  Patient location during evaluation: Phase II Anesthesia Type: MAC Level of consciousness: awake Pain management: pain level controlled Vital Signs Assessment: post-procedure vital signs reviewed and stable Respiratory status: spontaneous breathing and respiratory function stable Cardiovascular status: blood pressure returned to baseline and stable Postop Assessment: no headache and no apparent nausea or vomiting Anesthetic complications: no Comments: Late entry   No notable events documented.   Last Vitals:  Vitals:   07/04/24 1100 07/04/24 1103  BP: (!) 82/58 (!) 96/55  Pulse: (!) 110 (!) 106  Resp: (!) 25 19  Temp: 36.8 C   SpO2: 93% 95%    Last Pain:  Vitals:   07/04/24 1106  TempSrc:   PainSc: 0-No pain                 Yvonna PARAS Kataleena Holsapple      "

## 2024-07-11 ENCOUNTER — Encounter (HOSPITAL_COMMUNITY): Payer: Self-pay | Admitting: Internal Medicine

## 2024-07-24 ENCOUNTER — Ambulatory Visit: Admitting: Internal Medicine

## 2024-08-30 ENCOUNTER — Ambulatory Visit: Admitting: Gastroenterology
